# Patient Record
Sex: Male | Born: 1985
Health system: Southern US, Community
[De-identification: ages and names within clinical notes are randomized; demographics above are authoritative.]

## PROBLEM LIST (undated history)

## (undated) DIAGNOSIS — M549 Dorsalgia, unspecified: Secondary | ICD-10-CM

## (undated) DIAGNOSIS — K219 Gastro-esophageal reflux disease without esophagitis: Secondary | ICD-10-CM

## (undated) DIAGNOSIS — F32A Depression, unspecified: Secondary | ICD-10-CM

## (undated) DIAGNOSIS — F329 Major depressive disorder, single episode, unspecified: Secondary | ICD-10-CM

## (undated) DIAGNOSIS — F1910 Other psychoactive substance abuse, uncomplicated: Secondary | ICD-10-CM

## (undated) DIAGNOSIS — G8929 Other chronic pain: Secondary | ICD-10-CM

## (undated) DIAGNOSIS — F419 Anxiety disorder, unspecified: Secondary | ICD-10-CM

## (undated) HISTORY — PX: HERNIA REPAIR: SHX51

---
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
## 2010-03-19 ENCOUNTER — Emergency Department (HOSPITAL_COMMUNITY): Admission: EM | Admit: 2010-03-19 | Discharge: 2010-03-19 | Payer: Self-pay | Admitting: Emergency Medicine

## 2010-06-20 ENCOUNTER — Encounter (INDEPENDENT_AMBULATORY_CARE_PROVIDER_SITE_OTHER): Payer: Self-pay | Admitting: Family Medicine

## 2010-06-20 LAB — CONVERTED CEMR LAB
BUN: 16 mg/dL (ref 6–23)
CO2: 28 meq/L (ref 19–32)
Calcium: 9.9 mg/dL (ref 8.4–10.5)
Chloride: 103 meq/L (ref 96–112)
Creatinine, Ser: 1.06 mg/dL (ref 0.40–1.50)
Glucose, Bld: 90 mg/dL (ref 70–99)
HCT: 47.4 % (ref 39.0–52.0)
Helicobacter Pylori Antibody-IgG: <0.4
Hemoglobin: 16.1 g/dL (ref 13.0–17.0)
Lymphocytes Relative: 24 % (ref 12–46)
Monocytes Absolute: 0.7 10*3/uL (ref 0.1–1.0)
Monocytes Relative: 9 % (ref 3–12)
Neutro Abs: 4.6 10*3/uL (ref 1.7–7.7)
RBC: 5.25 M/uL (ref 4.22–5.81)

## 2010-09-11 ENCOUNTER — Other Ambulatory Visit: Payer: Self-pay | Admitting: Family Medicine

## 2010-09-11 ENCOUNTER — Ambulatory Visit (HOSPITAL_COMMUNITY)
Admission: RE | Admit: 2010-09-11 | Discharge: 2010-09-11 | Disposition: A | Discharge status: Home / Self Care | Payer: Self-pay | Source: Ambulatory Visit | Attending: Family Medicine | Admitting: Family Medicine

## 2010-09-11 DIAGNOSIS — R52 Pain, unspecified: Secondary | ICD-10-CM

## 2010-09-11 DIAGNOSIS — R079 Chest pain, unspecified: Secondary | ICD-10-CM | POA: Insufficient documentation

## 2010-09-19 ENCOUNTER — Encounter (INDEPENDENT_AMBULATORY_CARE_PROVIDER_SITE_OTHER): Payer: Self-pay | Admitting: Family Medicine

## 2010-09-19 LAB — CONVERTED CEMR LAB
Chlamydia, Swab/Urine, PCR: NEGATIVE
GC Probe Amp, Urine: NEGATIVE

## 2014-06-20 ENCOUNTER — Ambulatory Visit: Payer: Self-pay

## 2014-06-30 ENCOUNTER — Other Ambulatory Visit (HOSPITAL_COMMUNITY): Payer: Self-pay | Admitting: Neurosurgery

## 2014-07-03 ENCOUNTER — Encounter (HOSPITAL_COMMUNITY): Payer: Self-pay | Admitting: *Deleted

## 2014-07-03 NOTE — Progress Notes (Signed)
Pt denies SOB, chest pain, and being under the care of a cardiologist. Pt denies having a stress test, echo and cardiac cath. Pt denies having an EKG and chest x ray within the last year. Pt made aware to stop taking Aspirin, vitamins , and herbal medications ( Fish/mineral oils) . Do not take any NSAIDs ie: Ibuprofen, Advil, Naproxen or any medication containing Aspirin. Pt PCP Dr. Loma Senderharles [place], [place]

## 2014-07-04 ENCOUNTER — Encounter (HOSPITAL_COMMUNITY)
Admission: RE | Disposition: A | Discharge status: Home / Self Care | Payer: Self-pay | Source: Ambulatory Visit | Attending: Neurosurgery

## 2014-07-04 ENCOUNTER — Ambulatory Visit (HOSPITAL_COMMUNITY): Payer: 59 | Admitting: Anesthesiology

## 2014-07-04 ENCOUNTER — Ambulatory Visit (HOSPITAL_COMMUNITY): Payer: 59

## 2014-07-04 ENCOUNTER — Ambulatory Visit (HOSPITAL_COMMUNITY)
Admission: RE | Admit: 2014-07-04 | Discharge: 2014-07-04 | Disposition: A | Discharge status: Home / Self Care | Payer: 59 | Source: Ambulatory Visit | Attending: Neurosurgery | Admitting: Neurosurgery

## 2014-07-04 ENCOUNTER — Encounter (HOSPITAL_COMMUNITY): Payer: Self-pay | Admitting: *Deleted

## 2014-07-04 DIAGNOSIS — K219 Gastro-esophageal reflux disease without esophagitis: Secondary | ICD-10-CM | POA: Diagnosis not present

## 2014-07-04 DIAGNOSIS — F1099 Alcohol use, unspecified with unspecified alcohol-induced disorder: Secondary | ICD-10-CM | POA: Insufficient documentation

## 2014-07-04 DIAGNOSIS — M5126 Other intervertebral disc displacement, lumbar region: Secondary | ICD-10-CM | POA: Diagnosis not present

## 2014-07-04 DIAGNOSIS — M5127 Other intervertebral disc displacement, lumbosacral region: Secondary | ICD-10-CM

## 2014-07-04 HISTORY — DX: Other chronic pain: G89.29

## 2014-07-04 HISTORY — DX: Gastro-esophageal reflux disease without esophagitis: K21.9

## 2014-07-04 HISTORY — DX: Dorsalgia, unspecified: M54.9

## 2014-07-04 HISTORY — DX: Depression, unspecified: F32.A

## 2014-07-04 HISTORY — PX: SPINE SURGERY: SHX786

## 2014-07-04 HISTORY — DX: Major depressive disorder, single episode, unspecified: F32.9

## 2014-07-04 HISTORY — DX: Anxiety disorder, unspecified: F41.9

## 2014-07-04 HISTORY — PX: LUMBAR LAMINECTOMY/DECOMPRESSION MICRODISCECTOMY: SHX5026

## 2014-07-04 LAB — COMPREHENSIVE METABOLIC PANEL
ALT: 27 U/L (ref 0–53)
AST: 18 U/L (ref 0–37)
Albumin: 4.5 g/dL (ref 3.5–5.2)
Alkaline Phosphatase: 53 U/L (ref 39–117)
Anion gap: 15 (ref 5–15)
BUN: 16 mg/dL (ref 6–23)
CALCIUM: 9.3 mg/dL (ref 8.4–10.5)
CHLORIDE: 105 meq/L (ref 96–112)
CO2: 23 mEq/L (ref 19–32)
CREATININE: 1.09 mg/dL (ref 0.50–1.35)
Glucose, Bld: 100 mg/dL — ABNORMAL HIGH (ref 70–99)
POTASSIUM: 3.6 meq/L — AB (ref 3.7–5.3)
Sodium: 143 mEq/L (ref 137–147)
TOTAL PROTEIN: 7.3 g/dL (ref 6.0–8.3)
Total Bilirubin: 0.8 mg/dL (ref 0.3–1.2)

## 2014-07-04 LAB — CBC
HCT: 44.4 % (ref 39.0–52.0)
HEMOGLOBIN: 15.5 g/dL (ref 13.0–17.0)
MCH: 31.1 pg (ref 26.0–34.0)
MCHC: 34.9 g/dL (ref 30.0–36.0)
MCV: 89.2 fL (ref 78.0–100.0)
PLATELETS: 224 10*3/uL (ref 150–400)
RBC: 4.98 MIL/uL (ref 4.22–5.81)
RDW: 11.9 % (ref 11.5–15.5)
WBC: 5.6 10*3/uL (ref 4.0–10.5)

## 2014-07-04 LAB — SURGICAL PCR SCREEN
MRSA, PCR: NEGATIVE
STAPHYLOCOCCUS AUREUS: NEGATIVE

## 2014-07-04 SURGERY — LUMBAR LAMINECTOMY/DECOMPRESSION MICRODISCECTOMY 1 LEVEL
Anesthesia: General | Site: Spine Lumbar

## 2014-07-04 MED ORDER — FENTANYL CITRATE 0.05 MG/ML IJ SOLN
INTRAMUSCULAR | Status: DC | PRN
  Administered 2014-07-04: 200 ug via INTRAVENOUS
  Administered 2014-07-04: 50 ug via INTRAVENOUS

## 2014-07-04 MED ORDER — NEOSTIGMINE METHYLSULFATE 10 MG/10ML IV SOLN
INTRAVENOUS | Status: AC
  Filled 2014-07-04: qty 1

## 2014-07-04 MED ORDER — ACETAMINOPHEN 325 MG PO TABS: 650.0000 mg | ORAL_TABLET | ORAL | Status: DC | PRN

## 2014-07-04 MED ORDER — ONDANSETRON HCL 4 MG/2ML IJ SOLN: 4.0000 mg | INTRAMUSCULAR | Status: DC | PRN

## 2014-07-04 MED ORDER — DEXMEDETOMIDINE HCL 200 MCG/2ML IV SOLN
INTRAVENOUS | Status: DC | PRN
  Administered 2014-07-04 (×2): 12 ug via INTRAVENOUS
  Administered 2014-07-04: 8 ug via INTRAVENOUS

## 2014-07-04 MED ORDER — FENTANYL CITRATE 0.05 MG/ML IJ SOLN
INTRAMUSCULAR | Status: AC
  Filled 2014-07-04: qty 5

## 2014-07-04 MED ORDER — LACTATED RINGERS IV SOLN
INTRAVENOUS | Status: DC
  Administered 2014-07-04: 10:00:00 via INTRAVENOUS

## 2014-07-04 MED ORDER — OXYCODONE HCL 5 MG/5ML PO SOLN: 5.0000 mg | Freq: Once | ORAL | Status: AC | PRN

## 2014-07-04 MED ORDER — LIDOCAINE-EPINEPHRINE 0.5 %-1:200000 IJ SOLN
INTRAMUSCULAR | Status: DC | PRN
  Administered 2014-07-04: 10 mL

## 2014-07-04 MED ORDER — PROMETHAZINE HCL 25 MG/ML IJ SOLN: 6.2500 mg | INTRAMUSCULAR | Status: DC | PRN

## 2014-07-04 MED ORDER — ONDANSETRON HCL 4 MG/2ML IJ SOLN
INTRAMUSCULAR | Status: DC | PRN
Start: 2014-07-04 — End: 2014-07-04
  Administered 2014-07-04: 4 mg via INTRAVENOUS

## 2014-07-04 MED ORDER — MUPIROCIN 2 % EX OINT
TOPICAL_OINTMENT | Freq: Once | CUTANEOUS | Status: AC
  Administered 2014-07-04: 1 via NASAL
  Filled 2014-07-04: qty 22

## 2014-07-04 MED ORDER — MENTHOL 3 MG MT LOZG
1.0000 | LOZENGE | OROMUCOSAL | Status: DC | PRN
  Filled 2014-07-04: qty 9

## 2014-07-04 MED ORDER — ACETAMINOPHEN 650 MG RE SUPP: 650.0000 mg | RECTAL | Status: DC | PRN

## 2014-07-04 MED ORDER — SENNA 8.6 MG PO TABS: 1.0000 | ORAL_TABLET | Freq: Two times a day (BID) | ORAL | Status: DC

## 2014-07-04 MED ORDER — PROPOFOL 10 MG/ML IV BOLUS
INTRAVENOUS | Status: AC
  Filled 2014-07-04: qty 20

## 2014-07-04 MED ORDER — CYCLOBENZAPRINE HCL 10 MG PO TABS
10.0000 mg | ORAL_TABLET | Freq: Three times a day (TID) | ORAL | Status: DC | PRN
  Administered 2014-07-04: 10 mg via ORAL

## 2014-07-04 MED ORDER — HEMOSTATIC AGENTS (NO CHARGE) OPTIME
TOPICAL | Status: DC | PRN
  Administered 2014-07-04: 1 via TOPICAL

## 2014-07-04 MED ORDER — HYDROCODONE-ACETAMINOPHEN 5-325 MG PO TABS: 1.0000 | ORAL_TABLET | Freq: Four times a day (QID) | ORAL | Status: DC | PRN

## 2014-07-04 MED ORDER — OXYCODONE-ACETAMINOPHEN 5-325 MG PO TABS: 1.0000 | ORAL_TABLET | ORAL | Status: DC | PRN

## 2014-07-04 MED ORDER — ROCURONIUM BROMIDE 100 MG/10ML IV SOLN
INTRAVENOUS | Status: DC | PRN
  Administered 2014-07-04: 50 mg via INTRAVENOUS

## 2014-07-04 MED ORDER — SODIUM CHLORIDE 0.9 % IV SOLN: 250.0000 mL | INTRAVENOUS | Status: DC

## 2014-07-04 MED ORDER — OXYCODONE HCL 5 MG PO TABS
ORAL_TABLET | ORAL | Status: AC
  Filled 2014-07-04: qty 1

## 2014-07-04 MED ORDER — POLYETHYLENE GLYCOL 3350 17 G PO PACK
17.0000 g | PACK | Freq: Every day | ORAL | Status: DC | PRN
  Filled 2014-07-04: qty 1

## 2014-07-04 MED ORDER — ARTIFICIAL TEARS OP OINT
TOPICAL_OINTMENT | OPHTHALMIC | Status: DC | PRN
  Administered 2014-07-04: 1 via OPHTHALMIC

## 2014-07-04 MED ORDER — PHENOL 1.4 % MT LIQD: 1.0000 | OROMUCOSAL | Status: DC | PRN

## 2014-07-04 MED ORDER — POTASSIUM CHLORIDE IN NACL 20-0.9 MEQ/L-% IV SOLN
INTRAVENOUS | Status: DC
  Filled 2014-07-04 (×2): qty 1000

## 2014-07-04 MED ORDER — OXYCODONE HCL 5 MG PO TABS
5.0000 mg | ORAL_TABLET | Freq: Once | ORAL | Status: AC | PRN
  Administered 2014-07-04: 5 mg via ORAL

## 2014-07-04 MED ORDER — LACTATED RINGERS IV SOLN
INTRAVENOUS | Status: DC | PRN
  Administered 2014-07-04 (×2): via INTRAVENOUS

## 2014-07-04 MED ORDER — ONDANSETRON HCL 4 MG/2ML IJ SOLN
INTRAMUSCULAR | Status: AC
  Filled 2014-07-04: qty 2

## 2014-07-04 MED ORDER — GLYCOPYRROLATE 0.2 MG/ML IJ SOLN
INTRAMUSCULAR | Status: DC | PRN
  Administered 2014-07-04: .4 mg via INTRAVENOUS

## 2014-07-04 MED ORDER — DEXAMETHASONE SODIUM PHOSPHATE 10 MG/ML IJ SOLN
INTRAMUSCULAR | Status: AC
  Filled 2014-07-04: qty 1

## 2014-07-04 MED ORDER — LIDOCAINE HCL (CARDIAC) 20 MG/ML IV SOLN
INTRAVENOUS | Status: DC | PRN
  Administered 2014-07-04: 100 mg via INTRAVENOUS

## 2014-07-04 MED ORDER — HYDROMORPHONE HCL 1 MG/ML IJ SOLN
0.2500 mg | INTRAMUSCULAR | Status: DC | PRN
  Administered 2014-07-04 (×2): 0.5 mg via INTRAVENOUS

## 2014-07-04 MED ORDER — MIDAZOLAM HCL 5 MG/5ML IJ SOLN
INTRAMUSCULAR | Status: DC | PRN
  Administered 2014-07-04: 2 mg via INTRAVENOUS

## 2014-07-04 MED ORDER — CYCLOBENZAPRINE HCL 10 MG PO TABS: 10.0000 mg | ORAL_TABLET | Freq: Three times a day (TID) | ORAL | Status: DC | PRN

## 2014-07-04 MED ORDER — PHENYLEPH-SHARK LIV OIL-MO-PET 0.25-3-14-71.9 % RE OINT: 1.0000 "application " | TOPICAL_OINTMENT | Freq: Two times a day (BID) | RECTAL | Status: DC | PRN

## 2014-07-04 MED ORDER — CYCLOBENZAPRINE HCL 10 MG PO TABS
ORAL_TABLET | ORAL | Status: AC
  Filled 2014-07-04: qty 1

## 2014-07-04 MED ORDER — HYDROMORPHONE HCL 1 MG/ML IJ SOLN
INTRAMUSCULAR | Status: AC
  Filled 2014-07-04: qty 1

## 2014-07-04 MED ORDER — MIDAZOLAM HCL 2 MG/2ML IJ SOLN
INTRAMUSCULAR | Status: AC
  Filled 2014-07-04: qty 2

## 2014-07-04 MED ORDER — THROMBIN 5000 UNITS EX SOLR
CUTANEOUS | Status: DC | PRN
  Administered 2014-07-04 (×2): 5000 [IU] via TOPICAL

## 2014-07-04 MED ORDER — SODIUM CHLORIDE 0.9 % IJ SOLN: 3.0000 mL | Freq: Two times a day (BID) | INTRAMUSCULAR | Status: DC

## 2014-07-04 MED ORDER — HYDROMORPHONE HCL 1 MG/ML IJ SOLN: 0.5000 mg | INTRAMUSCULAR | Status: DC | PRN

## 2014-07-04 MED ORDER — DEXMEDETOMIDINE HCL IN NACL 200 MCG/50ML IV SOLN
INTRAVENOUS | Status: AC
  Filled 2014-07-04: qty 50

## 2014-07-04 MED ORDER — HYDROCODONE-ACETAMINOPHEN 5-325 MG PO TABS: 1.0000 | ORAL_TABLET | ORAL | Status: DC | PRN

## 2014-07-04 MED ORDER — DEXAMETHASONE SODIUM PHOSPHATE 10 MG/ML IJ SOLN
INTRAMUSCULAR | Status: DC | PRN
  Administered 2014-07-04: 10 mg via INTRAVENOUS

## 2014-07-04 MED ORDER — 0.9 % SODIUM CHLORIDE (POUR BTL) OPTIME
TOPICAL | Status: DC | PRN
  Administered 2014-07-04: 1000 mL

## 2014-07-04 MED ORDER — SODIUM CHLORIDE 0.9 % IJ SOLN
3.0000 mL | INTRAMUSCULAR | Status: DC | PRN
Start: 2014-07-04 — End: 2014-07-04

## 2014-07-04 MED ORDER — NEOSTIGMINE METHYLSULFATE 10 MG/10ML IV SOLN
INTRAVENOUS | Status: DC | PRN
  Administered 2014-07-04: 3 mg via INTRAVENOUS

## 2014-07-04 MED ORDER — CEFAZOLIN SODIUM-DEXTROSE 2-3 GM-% IV SOLR
INTRAVENOUS | Status: DC | PRN
  Administered 2014-07-04: 2 g via INTRAVENOUS

## 2014-07-04 MED ORDER — GLYCOPYRROLATE 0.2 MG/ML IJ SOLN
INTRAMUSCULAR | Status: AC
  Filled 2014-07-04: qty 2

## 2014-07-04 MED ORDER — PROPOFOL 10 MG/ML IV BOLUS
INTRAVENOUS | Status: DC | PRN
  Administered 2014-07-04: 200 mg via INTRAVENOUS

## 2014-07-04 SURGICAL SUPPLY — 53 items
APL SKNCLS STERI-STRIP NONHPOA (GAUZE/BANDAGES/DRESSINGS)
BAG DECANTER FOR FLEXI CONT (MISCELLANEOUS) IMPLANT
BENZOIN TINCTURE PRP APPL 2/3 (GAUZE/BANDAGES/DRESSINGS) IMPLANT
BLADE CLIPPER SURG (BLADE) IMPLANT
BUR MATCHSTICK NEURO 3.0 LAGG (BURR) ×2 IMPLANT
CANISTER SUCT 3000ML (MISCELLANEOUS) ×2 IMPLANT
CONT SPEC 4OZ CLIKSEAL STRL BL (MISCELLANEOUS) ×2 IMPLANT
DECANTER SPIKE VIAL GLASS SM (MISCELLANEOUS) ×2 IMPLANT
DRAPE LAPAROTOMY 100X72X124 (DRAPES) ×2 IMPLANT
DRAPE MICROSCOPE LEICA (MISCELLANEOUS) ×2 IMPLANT
DRAPE POUCH INSTRU U-SHP 10X18 (DRAPES) ×2 IMPLANT
DRAPE SURG 17X23 STRL (DRAPES) ×2 IMPLANT
DURAPREP 26ML APPLICATOR (WOUND CARE) ×2 IMPLANT
ELECT REM PT RETURN 9FT ADLT (ELECTROSURGICAL) ×2
ELECTRODE REM PT RTRN 9FT ADLT (ELECTROSURGICAL) ×1 IMPLANT
GAUZE SPONGE 4X4 12PLY STRL (GAUZE/BANDAGES/DRESSINGS) IMPLANT
GAUZE SPONGE 4X4 16PLY XRAY LF (GAUZE/BANDAGES/DRESSINGS) IMPLANT
GLOVE BIOGEL PI IND STRL 7.0 (GLOVE) IMPLANT
GLOVE BIOGEL PI IND STRL 8 (GLOVE) IMPLANT
GLOVE BIOGEL PI IND STRL 8.5 (GLOVE) IMPLANT
GLOVE BIOGEL PI INDICATOR 7.0 (GLOVE) ×2
GLOVE BIOGEL PI INDICATOR 8 (GLOVE) ×2
GLOVE BIOGEL PI INDICATOR 8.5 (GLOVE) ×1
GLOVE ECLIPSE 6.5 STRL STRAW (GLOVE) ×2 IMPLANT
GLOVE ECLIPSE 7.5 STRL STRAW (GLOVE) ×3 IMPLANT
GLOVE SS BIOGEL STRL SZ 6.5 (GLOVE) IMPLANT
GLOVE SUPERSENSE BIOGEL SZ 6.5 (GLOVE) ×2
GOWN STRL REUS W/ TWL LRG LVL3 (GOWN DISPOSABLE) ×2 IMPLANT
GOWN STRL REUS W/ TWL XL LVL3 (GOWN DISPOSABLE) IMPLANT
GOWN STRL REUS W/TWL 2XL LVL3 (GOWN DISPOSABLE) ×1 IMPLANT
GOWN STRL REUS W/TWL LRG LVL3 (GOWN DISPOSABLE) ×4
GOWN STRL REUS W/TWL XL LVL3 (GOWN DISPOSABLE)
KIT BASIN OR (CUSTOM PROCEDURE TRAY) ×2 IMPLANT
KIT ROOM TURNOVER OR (KITS) ×2 IMPLANT
LIQUID BAND (GAUZE/BANDAGES/DRESSINGS) ×2 IMPLANT
NDL HYPO 25X1 1.5 SAFETY (NEEDLE) ×1 IMPLANT
NEEDLE HYPO 25X1 1.5 SAFETY (NEEDLE) ×2 IMPLANT
NEEDLE SPNL 18GX3.5 QUINCKE PK (NEEDLE) ×2 IMPLANT
NS IRRIG 1000ML POUR BTL (IV SOLUTION) ×2 IMPLANT
PACK LAMINECTOMY NEURO (CUSTOM PROCEDURE TRAY) ×2 IMPLANT
PAD ARMBOARD 7.5X6 YLW CONV (MISCELLANEOUS) ×8 IMPLANT
RUBBERBAND STERILE (MISCELLANEOUS) ×4 IMPLANT
SPONGE LAP 4X18 X RAY DECT (DISPOSABLE) IMPLANT
SPONGE SURGIFOAM ABS GEL SZ50 (HEMOSTASIS) ×2 IMPLANT
STRIP CLOSURE SKIN 1/2X4 (GAUZE/BANDAGES/DRESSINGS) IMPLANT
SUT VIC AB 0 CT1 18XCR BRD8 (SUTURE) ×1 IMPLANT
SUT VIC AB 0 CT1 8-18 (SUTURE) ×2
SUT VIC AB 2-0 CT1 18 (SUTURE) ×2 IMPLANT
SUT VIC AB 3-0 SH 8-18 (SUTURE) ×2 IMPLANT
SYR 20ML ECCENTRIC (SYRINGE) ×2 IMPLANT
TOWEL OR 17X24 6PK STRL BLUE (TOWEL DISPOSABLE) ×2 IMPLANT
TOWEL OR 17X26 10 PK STRL BLUE (TOWEL DISPOSABLE) ×2 IMPLANT
WATER STERILE IRR 1000ML POUR (IV SOLUTION) ×2 IMPLANT

## 2014-07-04 NOTE — Anesthesia Preprocedure Evaluation (Addendum)
Anesthesia Evaluation  Patient identified by MRN, date of birth, ID band Patient awake    Reviewed: Allergy & Precautions, H&P , NPO status , Patient's Chart, lab work & pertinent test results  Airway Mallampati: I  TM Distance: >3 FB Neck ROM: Full    Dental  (+) Teeth Intact, Dental Advisory Given, Caps   Pulmonary neg pulmonary ROS,  breath sounds clear to auscultation        Cardiovascular negative cardio ROS  Rhythm:Regular Rate:Normal     Neuro/Psych negative neurological ROS     GI/Hepatic GERD-  ,(+)     substance abuse  alcohol use, Pt reports quit 2 weeks ago but endorses heavy 5x/wk ETOH abuse.   Endo/Other  negative endocrine ROS  Renal/GU negative Renal ROS     Musculoskeletal negative musculoskeletal ROS (+)   Abdominal   Peds  Hematology negative hematology ROS (+)   Anesthesia Other Findings Right front tooth capped  Reproductive/Obstetrics                           Anesthesia Physical Anesthesia Plan  ASA: II  Anesthesia Plan: General   Post-op Pain Management:    Induction: Intravenous  Airway Management Planned: Oral ETT  Additional Equipment:   Intra-op Plan:   Post-operative Plan: Extubation in OR  Informed Consent: I have reviewed the patients History and Physical, chart, labs and discussed the procedure including the risks, benefits and alternatives for the proposed anesthesia with the patient or authorized representative who has indicated his/her understanding and acceptance.   Dental advisory given and Dental Advisory Given  Plan Discussed with: CRNA, Surgeon and Anesthesiologist  Anesthesia Plan Comments:        Anesthesia Quick Evaluation

## 2014-07-04 NOTE — Discharge Instructions (Addendum)
Lumbar Discectomy Care After A discectomy involves removal of discmaterial (the cartilage-like structures located between the bones of the back). It is done to relieve pressure on nerve roots. It can be used as a treatment for a back problem. The time in surgery depends on the findings in surgery and what is necessary to correct the problems. HOME CARE INSTRUCTIONS   Check the cut (incision) made by the surgeon twice a day for signs of infection. Some signs of infection may include:   A foul smelling, greenish or yellowish discharge from the wound.   Increased pain.   Increased redness over the incision (operative) site.   The skin edges may separate.   Flu-like symptoms (problems).   A temperature above 101.5 F (38.6 C).   Change your bandages in about 24 to 36 hours following surgery or as directed.   You may shower tomrrow.  Avoid bathtubs, swimming pools and hot tubs for three weeks or until your incision has healed completely.  Follow your doctor's instructions as to safe activities, exercises, and physical therapy.   Weight reduction may be beneficial if you are overweight.   Daily exercise is helpful to prevent the return of problems. Walking is permitted. You may use a treadmill without an incline. Cut down on activities and exercise if you have discomfort. You may also go up and down stairs as much as you can tolerate.   DO NOT lift anything heavier than 10 to 15 lbs. Avoid bending or twisting at the waist. Always bend your knees when lifting.   Maintain strength and range of motion as instructed.   Do not drive for 10 days, or as directed by your doctors. You may be a passenger . Lying back in the passenger seat may be more comfortable for you. Always wear a seatbelt.   Limit your sitting in a regular chair to 20 to 30 minutes at a time. There are no limitations for sitting in a recliner. You should lie down or walk in between sitting periods.   Only take  over-the-counter or prescription medicines for pain, discomfort, or fever as directed by your caregiver.  SEEK MEDICAL CARE IF:   There is increased bleeding (more than a small spot) from the wound.   You notice redness, swelling, or increasing pain in the wound.   Pus is coming from wound.   You develop an unexplained oral temperature above 102 F (38.9 C) develops.   You notice a foul smell coming from the wound or dressing.   You have increasing pain in your wound.  SEEK IMMEDIATE MEDICAL CARE IF:   You develop a rash.   You have difficulty breathing.   You develop any allergic problems to medicines given.  Document Released: 07/02/2004 Document Revised: 07/17/2011 Document Reviewed: 10/21/2007 ExitCare Patient Information     Wound Care Leave incision open to air. You may shower. Do not scrub directly on incision.  Do not put any creams, lotions, or ointments on incision. Activity Walk each and every day, increasing distance each day. No lifting greater than 5 lbs.  Avoid bending, arching, and twisting. No driving for 2 weeks; may ride as a passenger locally.  Diet Resume your normal diet.  Return to Work Will be discussed at you follow up appointment. Call Your Doctor If Any of These Occur Redness, drainage, or swelling at the wound.  Temperature greater than 101 degrees. Severe pain not relieved by pain medication. Incision starts to come apart. Follow Up Appt  Call today for appointment in 4 weeks [phone] or for problems.  If you have any hardware placed in your spine, you will need an x-ray before your appointment.

## 2014-07-04 NOTE — Op Note (Signed)
07/04/2014  3:54 PM  PATIENT:  Daniel Phelps  28 y.o. male with pain in the left S1 distribution, and a large herniated disc at L5/S1 eccentric to the left. No response to conservative treatment  PRE-OPERATIVE DIAGNOSIS:  lumbar herniated disc L5/S1  POST-OPERATIVE DIAGNOSIS:  lumbar herniated disc L5/S1  PROCEDURE:  Procedure(s): LUMBAR LAMINECTOMY/DECOMPRESSION MICRODISCECTOMY 1 LEVEL,LEFT LUMBAR FIVE-SACRAL ONE  SURGEON:  Prinston Kynard L  ASSISTANTS:Stern, joseph   ANESTHESIA:   general  EBL:  Total I/O In: 1500 [I.V.:1500] Out: -   BLOOD ADMINISTERED:none  CELL SAVER GIVEN:none  COUNT:per nursing  DRAINS: none   SPECIMEN:  No Specimen  DICTATION: Mr. Daniel Phelps was taken to the operating room, intubated and placed under a general anesthetic without difficulty. He was positioned prone on a Wilson frame with all pressure points padded. His back was prepped and draped in a sterile manner. I opened the skin with a 10 blade and carried the dissection down to the thoracolumbar fascia. I used both sharp dissection and the monopolar cautery to expose the lamina of L5, and S1. I confirmed my location with an intraoperative xray.  I used the drill, Kerrison punches, and curettes to perform a semihemilaminectomy of L5. I used the punches to remove the ligamentum flavum to expose the thecal sac. I brought the microscope into the operative field and with Dr.Stern's assistance we started our decompression of the spinal canal, thecal sac and S1 root(s). I cauterized epidural veins overlying the disc space then divided them sharply. I opened the disc space with a 15 blade and proceeded with the discectomy. I used pituitary rongeurs, curettes, and other instruments to remove disc material. We did remove a significant amount of disc just underneath the annulus.  After the discectomy was completed we inspected the S1 nerve root and felt it was well decompressed. I explored rostrally, laterally, medially, and  caudally and was satisfied with the decompression. I irrigated the wound, then closed in layers. I approximated the thoracolumbar fascia, subcutaneous, and subcuticular planes with vicryl sutures. I used dermabond for a sterile dressing.   PLAN OF CARE: Admit for overnight observation  PATIENT DISPOSITION:  PACU - hemodynamically stable.   Delay start of Pharmacological VTE agent (>24hrs) due to surgical blood loss or risk of bleeding:  yes

## 2014-07-04 NOTE — Transfer of Care (Signed)
Immediate Anesthesia Transfer of Care Note  Patient: Daniel Phelps  Procedure(s) Performed: Procedure(s) with comments: LUMBAR LAMINECTOMY/DECOMPRESSION MICRODISCECTOMY 1 LEVEL,LEFT LUMBAR FIVE-SACRAL ONE (N/A) - left  Patient Location: PACU  Anesthesia Type:General  Level of Consciousness: awake, alert  and oriented  Airway & Oxygen Therapy: Patient Spontanous Breathing and Patient connected to nasal cannula oxygen  Post-op Assessment: Report given to PACU RN and Post -op Vital signs reviewed and stable  Post vital signs: Reviewed and stable  Complications: No apparent anesthesia complications

## 2014-07-04 NOTE — Plan of Care (Signed)
Problem: Consults Goal: Spinal Surgery Patient Education See Patient Education Module for education specifics. Outcome: Completed/Met Date Met:  07/04/14

## 2014-07-04 NOTE — Anesthesia Postprocedure Evaluation (Signed)
  Anesthesia Post-op Note  Patient: Daniel Phelps  Procedure(s) Performed: Procedure(s) with comments: LUMBAR LAMINECTOMY/DECOMPRESSION MICRODISCECTOMY 1 LEVEL,LEFT LUMBAR FIVE-SACRAL ONE (N/A) - left  Patient Location: PACU  Anesthesia Type:General  Level of Consciousness: awake  Airway and Oxygen Therapy: Patient Spontanous Breathing  Post-op Pain: mild  Post-op Assessment: Post-op Vital signs reviewed  Post-op Vital Signs: Reviewed  Last Vitals:  Filed Vitals:   07/04/14 1700  BP:   Pulse: 52  Temp:   Resp: 14    Complications: No apparent anesthesia complications

## 2014-07-04 NOTE — Anesthesia Procedure Notes (Signed)
Procedure Name: Intubation Date/Time: 07/04/2014 2:02 PM Performed by: Carmela RimaMARTINELLI, Crystalyn Delia F Pre-anesthesia Checklist: Patient identified, Timeout performed, Emergency Drugs available, Suction available and Patient being monitored Patient Re-evaluated:Patient Re-evaluated prior to inductionOxygen Delivery Method: Circle system utilized Preoxygenation: Pre-oxygenation with 100% oxygen Intubation Type: IV induction Laryngoscope Size: Mac and 3 Grade View: Grade I Tube type: Oral Tube size: 7.5 mm Number of attempts: 1 Placement Confirmation: positive ETCO2,  ETT inserted through vocal cords under direct vision and breath sounds checked- equal and bilateral Secured at: 23 cm Tube secured with: Tape Dental Injury: Teeth and Oropharynx as per pre-operative assessment

## 2014-07-04 NOTE — Progress Notes (Signed)
Discharge instructions/education given to patient with dad at bedside, they both verbalized understanding. No signs of infection on incision site, no drainage noted.

## 2014-07-04 NOTE — Discharge Summary (Signed)
  Physician Discharge Summary  Patient ID: Daniel Phelps MRN: [id number] DOB/AGE: 30/03/1986 28 y.o.  Admit date: 07/04/2014 Discharge date: 07/04/2014  Admission Diagnoses:HNP left L5/S1 Left S1 radiculopathy  Discharge Diagnoses:  Active Problems:   HNP (herniated nucleus pulposus), lumbar   Discharged Condition: good  Hospital Course: Daniel Phelps was admitted and taken to the operating room for an uncomplicated discetomy. His wound is clean, dry, and without signs of infection. He is moving all his extremities well.  He is tolerating a regular diet, ambulating, and voiding without difficulty.   Treatments: surgery: Left L5 semihemilaminectomy and L5/S1 discetomy   Discharge Exam: Blood pressure 132/75, pulse 67, temperature 98.1 F (36.7 C), temperature source Oral, resp. rate 18, height 6\' 2"  (1.88 m), weight 97.523 kg (215 lb), SpO2 99 %. General appearance: alert, cooperative, appears stated age and no distress Neurologic: Alert and oriented X 3, normal strength and tone. Normal symmetric reflexes. Normal coordination and gait  Disposition: Final discharge disposition not confirmed lumbar herniated disc    Medication List    TAKE these medications        cyclobenzaprine 10 MG tablet  Commonly known as:  FLEXERIL  Take 1 tablet (10 mg total) by mouth 3 (three) times daily as needed for muscle spasms.     FISH OIL PO  Take 1 tablet by mouth daily.     HYDROcodone-acetaminophen 5-325 MG per tablet  Commonly known as:  NORCO/VICODIN  Take 1 tablet by mouth every 6 (six) hours as needed for moderate pain.     MULTIVITAMIN PO  Take 1 tablet by mouth daily.     phenylephrine-shark liver oil-mineral oil-petrolatum 0.25-3-14-71.9 % rectal ointment  Commonly known as:  PREPARATION H  Place 1 application rectally 2 (two) times daily as needed for hemorrhoids.         Signed: Lisle Skillman L 07/04/2014, 5:41 PM

## 2014-07-04 NOTE — Plan of Care (Signed)
Problem: Consults Goal: Diagnosis - Spinal Surgery Outcome: Completed/Met Date Met:  07/04/14 Lumbar Laminectomy (Complex)

## 2014-07-04 NOTE — H&P (Signed)
Daniel Phelps is an 29 y.o. male.   Chief Complaint: left lower extremity pain HPI: pain in left lower extremity x 4 months. No response to therapy, medications, chiropractic treatment. MRI revealed an hnp at L5/S1 on the left.   Past Medical History  Diagnosis Date  . Chronic back pain   . Anxiety   . Depression   . GERD (gastroesophageal reflux disease)     Past Surgical History  Procedure Laterality Date  . Hernia repair      Family History  Problem Relation Age of Onset  . Other Mother   . Hernia Father    Social History:  reports that he has never smoked. He has never used smokeless tobacco. He reports that he drinks alcohol. He reports that he uses illicit drugs (Marijuana).  Allergies:  Allergies  Allergen Reactions  . Ibuprofen Other (See Comments)    Sends pain down spine    Medications Prior to Admission  Medication Sig Dispense Refill  . Multiple Vitamins-Minerals (MULTIVITAMIN PO) Take 1 tablet by mouth daily.    . Omega-3 Fatty Acids (FISH OIL PO) Take 1 tablet by mouth daily.    . phenylephrine-shark liver oil-mineral oil-petrolatum (PREPARATION H) 0.25-3-14-71.9 % rectal ointment Place 1 application rectally 2 (two) times daily as needed for hemorrhoids.      Results for orders placed or performed during the hospital encounter of 07/04/14 (from the past 48 hour(s))  Surgical pcr screen     Status: None   Collection Time: 07/04/14  9:28 AM  Result Value Ref Range   MRSA, PCR NEGATIVE NEGATIVE   Staphylococcus aureus NEGATIVE NEGATIVE    Comment:        The Xpert SA Assay (FDA approved for NASAL specimens in patients over 93 years of age), is one component of a comprehensive surveillance program.  Test performance has been validated by [employer] for patients greater than or equal to 61 year old. It is not intended to diagnose infection nor to guide or monitor treatment.   Comprehensive metabolic panel     Status: Abnormal   Collection Time:  07/04/14  9:39 AM  Result Value Ref Range   Sodium 143 137 - 147 mEq/Phelps   Potassium 3.6 (Phelps) 3.7 - 5.3 mEq/Phelps   Chloride 105 96 - 112 mEq/Phelps   CO2 23 19 - 32 mEq/Phelps   Glucose, Bld 100 (H) 70 - 99 mg/dL   BUN 16 6 - 23 mg/dL   Creatinine, Ser 1.09 0.50 - 1.35 mg/dL   Calcium 9.3 8.4 - 10.5 mg/dL   Total Protein 7.3 6.0 - 8.3 g/dL   Albumin 4.5 3.5 - 5.2 g/dL   AST 18 0 - 37 U/Phelps   ALT 27 0 - 53 U/Phelps   Alkaline Phosphatase 53 39 - 117 U/Phelps   Total Bilirubin 0.8 0.3 - 1.2 mg/dL   GFR calc non Af Amer >90 >90 mL/min   GFR calc Af Amer >90 >90 mL/min    Comment: (NOTE) The eGFR has been calculated using the CKD EPI equation. This calculation has not been validated in all clinical situations. eGFR's persistently <90 mL/min signify possible Chronic Kidney Disease.    Anion gap 15 5 - 15  CBC     Status: None   Collection Time: 07/04/14  9:39 AM  Result Value Ref Range   WBC 5.6 4.0 - 10.5 K/uL   RBC 4.98 4.22 - 5.81 MIL/uL   Hemoglobin 15.5 13.0 - 17.0 g/dL  HCT 44.4 39.0 - 52.0 %   MCV 89.2 78.0 - 100.0 fL   MCH 31.1 26.0 - 34.0 pg   MCHC 34.9 30.0 - 36.0 g/dL   RDW 11.9 11.5 - 15.5 %   Platelets 224 150 - 400 K/uL   No results found.  Review of Systems  HENT: Negative.   Eyes: Negative.   Respiratory: Negative.   Cardiovascular: Negative.   Gastrointestinal: Negative.   Genitourinary: Negative.   Musculoskeletal: Positive for back pain.  Skin: Negative.   Neurological: Positive for weakness.  Endo/Heme/Allergies: Negative.   Psychiatric/Behavioral: Negative.     Blood pressure 137/78, pulse 71, temperature 97.1 F (36.2 C), temperature source Oral, resp. rate 20, height _0  (1.88 m), weight 97.523 kg (215 lb), SpO2 100 %. Physical Exam  Vitals reviewed. Constitutional: He is oriented to person, place, and time. He appears well-developed and well-nourished.  HENT:  Head: Normocephalic and atraumatic.  Right Ear: External ear normal.  Left Ear: External ear normal.   Mouth/Throat: Oropharynx is clear and moist.  Eyes: Conjunctivae and EOM are normal. Pupils are equal, round, and reactive to light.  Neck: Normal range of motion. Neck supple.  Cardiovascular: Normal rate, regular rhythm and normal heart sounds.   Respiratory: Effort normal and breath sounds normal.  GI: Soft. Bowel sounds are normal.  Musculoskeletal: Normal range of motion.  Neurological: He is alert and oriented to person, place, and time. He displays abnormal reflex. No cranial nerve deficit. He exhibits normal muscle tone. He displays a negative Romberg sign. Gait abnormal. Coordination normal. He displays no Babinski's sign on the right side. He displays no Babinski's sign on the left side.  Mild weakness left gastrocnemius, can toe walk, can heel walk Proprioception is normal Decreased left ankle jerk     Assessment/Plan Or for decompression  Daniel Phelps 07/04/2014, 1:21 PM

## 2014-07-05 ENCOUNTER — Encounter (HOSPITAL_COMMUNITY): Payer: Self-pay | Admitting: Neurosurgery

## 2015-08-14 ENCOUNTER — Encounter: Payer: Self-pay | Admitting: Family Medicine

## 2015-08-14 ENCOUNTER — Ambulatory Visit (INDEPENDENT_AMBULATORY_CARE_PROVIDER_SITE_OTHER): Payer: BLUE CROSS/BLUE SHIELD | Admitting: Family Medicine

## 2015-08-14 VITALS — BP 136/83 | HR 83 | Temp 98.9°F | Resp 16 | Ht 73.0 in | Wt 209.4 lb

## 2015-08-14 DIAGNOSIS — Z130 Encounter for screening for diseases of the blood and blood-forming organs and certain disorders involving the immune mechanism: Secondary | ICD-10-CM | POA: Diagnosis not present

## 2015-08-14 DIAGNOSIS — T700XXA Otitic barotrauma, initial encounter: Secondary | ICD-10-CM

## 2015-08-14 DIAGNOSIS — F1010 Alcohol abuse, uncomplicated: Secondary | ICD-10-CM | POA: Diagnosis not present

## 2015-08-14 DIAGNOSIS — Z1389 Encounter for screening for other disorder: Secondary | ICD-10-CM | POA: Diagnosis not present

## 2015-08-14 DIAGNOSIS — Z13220 Encounter for screening for lipoid disorders: Secondary | ICD-10-CM | POA: Diagnosis not present

## 2015-08-14 DIAGNOSIS — M5442 Lumbago with sciatica, left side: Secondary | ICD-10-CM

## 2015-08-14 DIAGNOSIS — M5489 Other dorsalgia: Secondary | ICD-10-CM

## 2015-08-14 LAB — COMPLETE METABOLIC PANEL WITH GFR
ALT: 59 U/L — ABNORMAL HIGH (ref 9–46)
AST: 34 U/L (ref 10–40)
Albumin: 4.6 g/dL (ref 3.6–5.1)
Alkaline Phosphatase: 60 U/L (ref 40–115)
BUN: 16 mg/dL (ref 7–25)
CALCIUM: 9 mg/dL (ref 8.6–10.3)
CHLORIDE: 103 mmol/L (ref 98–110)
CO2: 26 mmol/L (ref 20–31)
Creat: 0.97 mg/dL (ref 0.60–1.35)
Glucose, Bld: 84 mg/dL (ref 65–99)
POTASSIUM: 4.2 mmol/L (ref 3.5–5.3)
Sodium: 141 mmol/L (ref 135–146)
Total Bilirubin: 0.6 mg/dL (ref 0.2–1.2)
Total Protein: 6.9 g/dL (ref 6.1–8.1)

## 2015-08-14 LAB — CBC
HEMATOCRIT: 44 % (ref 39.0–52.0)
Hemoglobin: 15.8 g/dL (ref 13.0–17.0)
MCH: 31.3 pg (ref 26.0–34.0)
MCHC: 35.9 g/dL (ref 30.0–36.0)
MCV: 87.3 fL (ref 78.0–100.0)
MPV: 9.6 fL (ref 8.6–12.4)
Platelets: 237 10*3/uL (ref 150–400)
RBC: 5.04 MIL/uL (ref 4.22–5.81)
RDW: 13 % (ref 11.5–15.5)
WBC: 8.4 10*3/uL (ref 4.0–10.5)

## 2015-08-14 LAB — LIPID PANEL
CHOL/HDL RATIO: 3.4 ratio (ref <=5.0)
CHOLESTEROL: 136 mg/dL (ref 125–200)
HDL: 40 mg/dL (ref >=40)
LDL Cholesterol: 59 mg/dL (ref <130)
TRIGLYCERIDES: 186 mg/dL — AB (ref <150)
VLDL: 37 mg/dL — AB (ref <30)

## 2015-08-14 MED ORDER — MELOXICAM 15 MG PO TABS: 15.0000 mg | ORAL_TABLET | Freq: Every day | ORAL | Status: DC

## 2015-08-14 NOTE — Patient Instructions (Signed)
For ear pressure, can try Afrin nasal spray twice a day for 3 day, regular saline spray, use Sudafed 2-3 times a day (avoid at bedtime, may keep you awake)  For your back- [facility]- [phone] [facility]- [phone]  Barotitis Media Barotitis media is inflammation of your middle ear. This occurs when the auditory tube (eustachian tube) leading from the back of your nose (nasopharynx) to your eardrum is blocked. This blockage may result from a cold, environmental allergies, or an upper respiratory infection. Unresolved barotitis media may lead to damage or hearing loss (barotrauma), which may become permanent. HOME CARE INSTRUCTIONS   Use medicines as recommended by your health care provider. Over-the-counter medicines will help unblock the canal and can help during times of air travel.  Do not put anything into your ears to clean or unplug them. Eardrops will not be helpful.  Do not swim, dive, or fly until your health care provider says it is all right to do so. If these activities are necessary, chewing gum with frequent, forceful swallowing may help. It is also helpful to hold your nose and gently blow to pop your ears for equalizing pressure changes. This forces air into the eustachian tube.  Only take over-the-counter or prescription medicines for pain, discomfort, or fever as directed by your health care provider.  A decongestant may be helpful in decongesting the middle ear and make pressure equalization easier. SEEK MEDICAL CARE IF:  You experience a serious form of dizziness in which you feel as if the room is spinning and you feel nauseated (vertigo).  Your symptoms only involve one ear. SEEK IMMEDIATE MEDICAL CARE IF:   You develop a severe headache, dizziness, or severe ear pain.  You have bloody or pus-like drainage from your ears.  You develop a fever.  Your problems do not improve or become worse. MAKE SURE YOU:   Understand  these instructions.  Will watch your condition.  Will get help right away if you are not doing well or get worse.   This information is not intended to replace advice given to you by your health care provider. Make sure you discuss any questions you have with your health care provider.   Document Released: 07/25/2000 Document Revised: 05/18/2013 Document Reviewed: 02/22/2013 Elsevier Interactive Patient Education [employer]2016 [employer]

## 2015-08-14 NOTE — Progress Notes (Signed)
Subjective:    Patient ID: Daniel Phelps, male    DOB: 12/20/1985, 30 y.o.   MRN: [id number]  HPI This is a 30 yo male who presents today to establish care. He was previously seen at Dr. Vear ClockPhillips office.  He has been having intermittent left ear pain for 3 days, worse with cold temperature (under 70). He also gets a sore throat with cold weather. He took some cold and flu medication without relief. No fever. Left sided nasal congestion. Denies history of allergies, has frequent nasal congestion and runny nose. No cough, no ear drainage. Little nasal drainage.   He had a diskectomy (L5/S1) 11/15. He has left sided sciatic pain if he drives for a long time or if he works. He has pain most days. He is requesting hydrocodone. He reports that ibuprofen makes his "spine hurt." He has not tried other NSAIDS. He did not have PT following surgery. He saw a chiropractor for awhile, but was unable to afford treatments. Gets some relief with acetaminophen. No weakness, no falls.   He has been trying to stop drinking and follow God. Last alcohol use 08/11/15, consumed 1/5 of vodka. He drinks when he is bored. Usually in social situations. "Drinks to get drunk." Does not think he can drink in moderation. Currently drinks 2-3 times per week.   Past Medical History  Diagnosis Date  . Chronic back pain   . Anxiety   . Depression   . GERD (gastroesophageal reflux disease)    Past Surgical History  Procedure Laterality Date  . Hernia repair    . Lumbar laminectomy/decompression microdiscectomy N/A 07/04/2014    Procedure: LUMBAR LAMINECTOMY/DECOMPRESSION MICRODISCECTOMY 1 LEVEL,LEFT LUMBAR FIVE-SACRAL ONE;  Surgeon: Coletta MemosKyle Cabbell, MD;  Location: [facility] OR;  Service: Neurosurgery;  Laterality: N/A;  left  . Spine surgery  07/05/2015   Family History  Problem Relation Age of Onset  . Other Mother   . Hernia Father    Social History  Substance Use Topics  . Smoking status: Never Smoker   . Smokeless tobacco:  Never Used  . Alcohol Use: Yes     Comment: 3-4 times per week; last used first week of November 2015   Review of Systems Per HPI    Objective:   Physical Exam  Constitutional: He is oriented to person, place, and time. He appears well-developed and well-nourished. No distress.  HENT:  Head: Normocephalic and atraumatic.  Right Ear: External ear and ear canal normal. A middle ear effusion is present.  Left Ear: External ear and ear canal normal. A middle ear effusion is present.  Nose: Mucosal edema and rhinorrhea present. Right sinus exhibits no maxillary sinus tenderness and no frontal sinus tenderness. Left sinus exhibits no maxillary sinus tenderness and no frontal sinus tenderness.  Mouth/Throat: Uvula is midline. Posterior oropharyngeal erythema present. No oropharyngeal exudate, posterior oropharyngeal edema or tonsillar abscesses.  Neck: Normal range of motion. Neck supple.  Cardiovascular: Normal rate, regular rhythm and normal heart sounds.   Pulmonary/Chest: Effort normal and breath sounds normal.  Musculoskeletal: Normal range of motion.  Lymphadenopathy:    He has no cervical adenopathy.  Neurological: He is alert and oriented to person, place, and time.  Skin: Skin is warm and dry. He is not diaphoretic.  Psychiatric: He has a normal mood and affect. His behavior is normal. Judgment and thought content normal.  Vitals reviewed.  BP 136/83 mmHg  Pulse 83  Temp(Src) 98.9 F (37.2 C) (Oral)  Resp 16  Ht 6\' 1"  (1.854 m)  Wt 209 lb 6.4 oz (94.983 kg)  BMI 27.63 kg/m2  SpO2 98%     Assessment & Plan:  1. Midline low back pain with left-sided sciatica - Discussed treatment options and why narcotic pain medication is not appropriate due to potential for abuse and chronic nature of condition. Offered patient to referral for additional therapy. He would like to research options himself and he will let me know if he needs referral. Provided numbers for [facility]  and Dr. Ethelene Hal.  - Encouraged continued exercise and maintaining healthy weight.  - meloxicam (MOBIC) 15 MG tablet; Take 1 tablet (15 mg total) by mouth daily.  Dispense: 30 tablet; Refill: 1  2. Screening for deficiency anemia - CBC  3. Screening for nephropathy - COMPLETE METABOLIC PANEL WITH GFR  4. Screening for lipid disorders - Lipid panel  5. Alcohol consumption binge drinking - Encouraged patient to abstain from alcohol completely if he can not drink 2 or fewer drinks when drinking, he is comfortable in his plan. Discussed avoiding triggers for drinking and participating in activities not involving drinking - COMPLETE METABOLIC PANEL WITH GFR  6. Barotitis media, initial encounter - Provided written and verbal information regarding diagnosis and treatment. - offered inhaled nasal corticosteroid, patient would prefer to try some other remedies first. - He will follow up if worsening symptoms or if no improvement in a couple of weeks.    Olean Ree, FNP-BC  [facility], [facility]  08/14/2015 5:14 PM

## 2016-10-15 ENCOUNTER — Encounter (HOSPITAL_COMMUNITY): Payer: Self-pay | Admitting: *Deleted

## 2016-10-15 ENCOUNTER — Ambulatory Visit (HOSPITAL_COMMUNITY)
Admission: EM | Admit: 2016-10-15 | Discharge: 2016-10-15 | Disposition: A | Discharge status: Home / Self Care | Payer: BLUE CROSS/BLUE SHIELD | Attending: Family Medicine | Admitting: Family Medicine

## 2016-10-15 DIAGNOSIS — J111 Influenza due to unidentified influenza virus with other respiratory manifestations: Secondary | ICD-10-CM

## 2016-10-15 DIAGNOSIS — R69 Illness, unspecified: Secondary | ICD-10-CM

## 2016-10-15 MED ORDER — TRAMADOL HCL 50 MG PO TABS: 50.0000 mg | ORAL_TABLET | Freq: Four times a day (QID) | ORAL | 0 refills | Status: DC | PRN

## 2016-10-15 MED ORDER — OSELTAMIVIR PHOSPHATE 75 MG PO CAPS: 75.0000 mg | ORAL_CAPSULE | Freq: Two times a day (BID) | ORAL | 0 refills | Status: DC

## 2016-10-15 NOTE — Discharge Instructions (Signed)
Allegra or Zyrtec daily as needed for drainage and runny nose. °For stronger antihistamine may take Chlor-Trimeton 2 to 4 mg every 4 to 6 hours, may cause drowsiness. °Saline nasal spray used frequently. °Ibuprofen 600 mg every 6 hours as needed for pain, discomfort or fever. °Drink plenty of fluids and stay well-hydrated. °

## 2016-10-15 NOTE — ED Triage Notes (Signed)
Pt  Reports   Cough     And   Congestion  With  Symptoms  Of  Sweats      And    Pain  In  Upper  Chest  And   Abdomen     Headache  When he  Coughs      Symptoms     Worse     When  Lays  Down

## 2016-10-15 NOTE — ED Provider Notes (Signed)
CSN: [id number]     Arrival date & time 10/15/16  1259 History   First MD Initiated Contact with Patient 10/15/16 1408     Chief Complaint  Patient presents with  . Cough   (Consider location/radiation/quality/duration/timing/severity/associated sxs/prior Treatment) 31 year old male presents to the urgent care stating yesterday, questionably, he developed cough, chills ribs hurting when he coughs, feeling warm and developing shortness of breath when he coughs only. His significant other states that he had a fever by feeling his forehead. It was not measured. Today 100.8.      Past Medical History:  Diagnosis Date  . Anxiety   . Chronic back pain   . Depression   . GERD (gastroesophageal reflux disease)    Past Surgical History:  Procedure Laterality Date  . HERNIA REPAIR    . LUMBAR LAMINECTOMY/DECOMPRESSION MICRODISCECTOMY N/A 07/04/2014   Procedure: LUMBAR LAMINECTOMY/DECOMPRESSION MICRODISCECTOMY 1 LEVEL,LEFT LUMBAR FIVE-SACRAL ONE;  Surgeon: Coletta MemosKyle Cabbell, MD;  Location: [facility] OR;  Service: Neurosurgery;  Laterality: N/A;  left  . SPINE SURGERY  07/05/2015   Family History  Problem Relation Age of Onset  . Other Mother   . Hernia Father    Social History  Substance Use Topics  . Smoking status: Never Smoker  . Smokeless tobacco: Never Used  . Alcohol use Yes     Comment: 3-4 times per week; last used first week of November 2015    Review of Systems  Constitutional: Positive for activity change, chills and fever. Negative for diaphoresis and fatigue.  HENT: Negative for congestion, ear pain, facial swelling, postnasal drip, rhinorrhea, sore throat and trouble swallowing.   Eyes: Negative for pain, discharge and redness.  Respiratory: Positive for cough. Negative for chest tightness, shortness of breath and wheezing.   Cardiovascular: Negative.   Gastrointestinal: Negative.   Musculoskeletal: Positive for myalgias. Negative for neck pain and neck stiffness.   Neurological: Positive for headaches.       Headache with cough only.  All other systems reviewed and are negative.   Allergies  Ibuprofen  Home Medications   Prior to Admission medications   Medication Sig Start Date End Date Taking? Authorizing Provider  oseltamivir (TAMIFLU) 75 MG capsule Take 1 capsule (75 mg total) by mouth 2 (two) times daily. X 5 days 10/15/16   Hayden Rasmussenavid Rashod Gougeon, NP  traMADol (ULTRAM) 50 MG tablet Take 1 tablet (50 mg total) by mouth every 6 (six) hours as needed for moderate pain. 10/15/16   Hayden Rasmussenavid Merian Wroe, NP   Meds Ordered and Administered this Visit  Medications - No data to display  BP 146/84 (BP Location: Right Arm)   Pulse 99   Temp 100.8 F (38.2 C)   Resp 18   SpO2 98%  No data found.   Physical Exam  Constitutional: He is oriented to person, place, and time. He appears well-developed and well-nourished. No distress.  Appears generally well. Speech and movement energetic. Not lethargic.  HENT:  Head: Normocephalic and atraumatic.  Mouth/Throat: No oropharyngeal exudate.  Bilateral TMs are retracted. Oropharynx with cobblestoning, streaky erythema and clear PND.  Eyes: EOM are normal.  Neck: Normal range of motion. Neck supple.  Cardiovascular: Normal rate, regular rhythm, normal heart sounds and intact distal pulses.   Pulmonary/Chest: Effort normal and breath sounds normal. No respiratory distress. He has no wheezes. He has no rales.  Musculoskeletal: Normal range of motion. He exhibits no edema.  Lymphadenopathy:    He has no cervical adenopathy.  Neurological: He is alert and  oriented to person, place, and time. He exhibits normal muscle tone. Coordination normal.  Skin: Skin is warm and dry. No rash noted. He is not diaphoretic.  Psychiatric: He has a normal mood and affect. His behavior is normal.  Nursing note and vitals reviewed.   Urgent Care Course     Procedures (including critical care time)  Labs Review Labs Reviewed - No data to  display  Imaging Review No results found.   Visual Acuity Review  Right Eye Distance:   Left Eye Distance:   Bilateral Distance:    Right Eye Near:   Left Eye Near:    Bilateral Near:         MDM   1. Influenza-like illness    Allegra or Zyrtec daily as needed for drainage and runny nose. For stronger antihistamine may take Chlor-Trimeton 2 to 4 mg every 4 to 6 hours, may cause drowsiness. Saline nasal spray used frequently. Ibuprofen 600 mg every 6 hours as needed for pain, discomfort or fever. Drink plenty of fluids and stay well-hydrated. Meds ordered this encounter  Medications  . traMADol (ULTRAM) 50 MG tablet    Sig: Take 1 tablet (50 mg total) by mouth every 6 (six) hours as needed for moderate pain.    Dispense:  15 tablet    Refill:  0    Order Specific Question:   Supervising Provider    Answer:   Elvina Sidle [[id number]]  . oseltamivir (TAMIFLU) 75 MG capsule    Sig: Take 1 capsule (75 mg total) by mouth 2 (two) times daily. X 5 days    Dispense:  10 capsule    Refill:  0    Order Specific Question:   Supervising Provider    Answer:   Elvina Sidle [[id number]]       Hayden Rasmussen, NP 10/15/16 [phone]

## 2017-02-16 ENCOUNTER — Encounter: Payer: Self-pay | Admitting: Emergency Medicine

## 2017-02-16 ENCOUNTER — Ambulatory Visit: Payer: BLUE CROSS/BLUE SHIELD | Admitting: Family Medicine

## 2017-02-16 ENCOUNTER — Ambulatory Visit (INDEPENDENT_AMBULATORY_CARE_PROVIDER_SITE_OTHER): Payer: BLUE CROSS/BLUE SHIELD | Admitting: Emergency Medicine

## 2017-02-16 VITALS — BP 110/70 | HR 74 | Temp 98.5°F | Wt 232.0 lb

## 2017-02-16 DIAGNOSIS — Z87898 Personal history of other specified conditions: Secondary | ICD-10-CM | POA: Diagnosis not present

## 2017-02-16 DIAGNOSIS — B353 Tinea pedis: Secondary | ICD-10-CM

## 2017-02-16 DIAGNOSIS — R0789 Other chest pain: Secondary | ICD-10-CM | POA: Diagnosis not present

## 2017-02-16 DIAGNOSIS — F1011 Alcohol abuse, in remission: Secondary | ICD-10-CM

## 2017-02-16 DIAGNOSIS — M67431 Ganglion, right wrist: Secondary | ICD-10-CM | POA: Insufficient documentation

## 2017-02-16 MED ORDER — CLOTRIMAZOLE-BETAMETHASONE 1-0.05 % EX CREA: 1.0000 "application " | TOPICAL_CREAM | Freq: Two times a day (BID) | CUTANEOUS | 0 refills | Status: DC

## 2017-02-16 NOTE — Progress Notes (Signed)
Daniel Phelps 31 y.o.   Chief Complaint  Patient presents with  . Chest Pain    WITH TIGHTNESS x 29month at bedtime and daytime  . Mass    right hand on back    HISTORY OF PRESENT ILLNESS: This is a 31 y.o. male complaining of intermittent episodes of chest pain x 1-2 months; not related to physical activity; sometimes short-lived and sometimes last all day; no associated symptoms and no radiation; non-smoker; daily ETOH user/abuser; has no cardiac risk factors. Also c/o mass to right wrist, getting bigger and now starting to hurt.  HPI   Prior to Admission medications   Medication Sig Start Date End Date Taking? Authorizing Provider  traMADol (ULTRAM) 50 MG tablet Take 1 tablet (50 mg total) by mouth every 6 (six) hours as needed for moderate pain. Patient not taking: Reported on 02/16/2017 10/15/16   Hayden Rasmussen, NP    Allergies  Allergen Reactions  . Ibuprofen Other (See Comments)    Sends pain down spine    Patient Active Problem List   Diagnosis Date Noted  . HNP (herniated nucleus pulposus), lumbar 07/04/2014    Past Medical History:  Diagnosis Date  . Anxiety   . Chronic back pain   . Depression   . GERD (gastroesophageal reflux disease)     Past Surgical History:  Procedure Laterality Date  . HERNIA REPAIR    . LUMBAR LAMINECTOMY/DECOMPRESSION MICRODISCECTOMY N/A 07/04/2014   Procedure: LUMBAR LAMINECTOMY/DECOMPRESSION MICRODISCECTOMY 1 LEVEL,LEFT LUMBAR FIVE-SACRAL ONE;  Surgeon: Coletta Memos, MD;  Location: [facility] OR;  Service: Neurosurgery;  Laterality: N/A;  left  . SPINE SURGERY  07/05/2015    Social History   Social History  . Marital status: Married    Spouse name: N/A  . Number of children: N/A  . Years of education: N/A   Occupational History  . Not on file.   Social History Main Topics  . Smoking status: Never Smoker  . Smokeless tobacco: Never Used  . Alcohol use Yes     Comment: 3-4 times per week; last used first week of November 2015  .  Drug use: Yes    Types: Marijuana     Comment: last used first week of November 2015  . Sexual activity: Not on file   Other Topics Concern  . Not on file   Social History Narrative  . No narrative on file    Family History  Problem Relation Age of Onset  . Other Mother   . Hernia Father      Review of Systems  Constitutional: Negative.  Negative for chills and fever.  HENT: Negative.   Eyes: Negative.   Respiratory: Negative for cough and shortness of breath.   Cardiovascular: Positive for chest pain and palpitations. Negative for leg swelling.  Gastrointestinal: Positive for abdominal pain (chronic LUQ pain). Negative for diarrhea, nausea and vomiting.  Genitourinary: Negative for dysuria and hematuria.  Musculoskeletal: Positive for joint pain (right wrist).  Skin: Positive for rash (left foot).  Neurological: Negative.  Negative for dizziness, sensory change, focal weakness and headaches.  Endo/Heme/Allergies: Negative.   All other systems reviewed and are negative.    Vitals:   02/16/17 1429  BP: 110/70  Pulse: 74  Temp: 98.5 F (36.9 C)     Physical Exam  Constitutional: He is oriented to person, place, and time. He appears well-developed and well-nourished.  HENT:  Head: Normocephalic and atraumatic.  Nose: Nose normal.  Mouth/Throat: Oropharynx is clear and  moist. No oropharyngeal exudate.  Eyes: Conjunctivae and EOM are normal. Pupils are equal, round, and reactive to light.  Neck: Normal range of motion. Neck supple. No JVD present. No thyromegaly present.  Cardiovascular: Normal rate, regular rhythm, normal heart sounds and intact distal pulses.   Pulmonary/Chest: Effort normal and breath sounds normal.  Abdominal: Soft. Bowel sounds are normal. He exhibits no distension and no mass. There is no tenderness. There is no rebound and no guarding.  Musculoskeletal: Normal range of motion.  Right wrist: +dorsal ganglionic cyst  Lymphadenopathy:    He has  no cervical adenopathy.  Neurological: He is alert and oriented to person, place, and time. He displays normal reflexes. No sensory deficit. He exhibits normal muscle tone.  Skin: Skin is warm and dry. Rash (left foot: +ringworm solitary lesion) noted.  Vitals reviewed.  EKG: NSR, no acute ischemic changes.  ASSESSMENT & PLAN: Doyle was seen today for chest pain and mass.  Diagnoses and all orders for this visit:  Atypical chest pain -     EKG 12-Lead  Ganglion cyst of dorsum of right wrist -     Ambulatory referral to Hand Surgery  Tinea pedis of left foot  H/O ETOH abuse  Other orders -     clotrimazole-betamethasone (LOTRISONE) cream; Apply 1 application topically 2 (two) times daily.    Patient Instructions   pc    IF you received an x-ray today, you will receive an invoice from [facility]. Please contact [facility] at [phone] with questions or concerns regarding your invoice.   IF you received labwork today, you will receive an invoice from [place]. Please contact [facility] at [phone] with questions or concerns regarding your invoice.   Our billing staff will not be able to assist you with questions regarding bills from these companies.  You will be contacted with the lab results as soon as they are available. The fastest way to get your results is to activate your My Chart account. Instructions are located on the last page of this paperwork. If you have not heard from [place] regarding the results in 2 weeks, please contact this office.     Ganglion Cyst A ganglion cyst is a noncancerous, fluid-filled lump that occurs near joints or tendons. The ganglion cyst grows out of a joint or the lining of a tendon. It most often develops in the hand or wrist, but it can also develop in the shoulder, elbow, hip, knee, ankle, or foot. The round or oval ganglion cyst can be the size of a pea or larger than a grape. Increased activity may enlarge the size of  the cyst because more fluid starts to build up. What are the causes? It is not known what causes a ganglion cyst to grow. However, it may be related to:  Inflammation or irritation around the joint.  An injury.  Repetitive movements or overuse.  Arthritis.  What increases the risk? Risk factors include:  Being a woman.  Being age 24-50.  What are the signs or symptoms? Symptoms may include:  A lump. This most often appears on the hand or wrist, but it can occur in other areas of the body.  Tingling.  Pain.  Numbness.  Muscle weakness.  Weak grip.  Less movement in a joint.  How is this diagnosed? Ganglion cysts are most often diagnosed based on a physical exam. Your health care provider will feel the lump and may shine a light alongside it. If it is a  ganglion cyst, a light often shines through it. Your health care provider may order an X-ray, ultrasound, or MRI to rule out other conditions. How is this treated? Ganglion cysts usually go away on their own without treatment. If pain or other symptoms are involved, treatment may be needed. Treatment is also needed if the ganglion cyst limits your movement or if it gets infected. Treatment may include:  Wearing a brace or splint on your wrist or finger.  Taking anti-inflammatory medicine.  Draining fluid from the lump with a needle (aspiration).  Injecting a steroid into the joint.  Surgery to remove the ganglion cyst.  Follow these instructions at home:  Do not press on the ganglion cyst, poke it with a needle, or hit it.  Take medicines only as directed by your health care provider.  Wear your brace or splint as directed by your health care provider.  Watch your ganglion cyst for any changes.  Keep all follow-up visits as directed by your health care provider. This is important. Contact a health care provider if:  Your ganglion cyst becomes larger or more painful.  You have increased redness, red  streaks, or swelling.  You have pus coming from the lump.  You have weakness or numbness in the affected area.  You have a fever or chills. This information is not intended to replace advice given to you by your health care provider. Make sure you discuss any questions you have with your health care provider. Document Released: 07/25/2000 Document Revised: 01/03/2016 Document Reviewed: 01/10/2014 Elsevier Interactive Patient Education  2018 Elsevier Inc.  Nonspecific Chest Pain Chest pain can be caused by many different conditions. There is a chance that your pain could be related to something serious, such as a heart attack or a blood clot in your lungs. Chest pain can also be caused by conditions that are not life-threatening. If you have chest pain, it is very important to follow up with your doctor. Follow these instructions at home: Medicines  If you were prescribed an antibiotic medicine, take it as told by your doctor. Do not stop taking the antibiotic even if you start to feel better.  Take over-the-counter and prescription medicines only as told by your doctor. Lifestyle  Do not use any products that contain nicotine or tobacco, such as cigarettes and e-cigarettes. If you need help quitting, ask your doctor.  Do not drink alcohol.  Make lifestyle changes as told by your doctor. These may include: ? Getting regular exercise. Ask your doctor for some activities that are safe for you. ? Eating a heart-healthy diet. A diet specialist (dietitian) can help you to learn healthy eating options. ? Staying at a healthy weight. ? Managing diabetes, if needed. ? Lowering your stress, as with deep breathing or spending time in nature. General instructions  Avoid any activities that make you feel chest pain.  If your chest pain is because of heartburn: ? Raise (elevate) the head of your bed about 6 inches (15 cm). You can do this by putting blocks under the bed legs at the head of the  bed. ? Do not sleep with extra pillows under your head. That does not help heartburn.  Keep all follow-up visits as told by your doctor. This is important. This includes any further testing if your chest pain does not go away. Contact a doctor if:  Your chest pain does not go away.  You have a rash with blisters on your chest.  You have a  fever.  You have chills. Get help right away if:  Your chest pain is worse.  You have a cough that gets worse, or you cough up blood.  You have very bad (severe) pain in your belly (abdomen).  You are very weak.  You pass out (faint).  You have either of these for no clear reason: ? Sudden chest discomfort. ? Sudden discomfort in your arms, back, neck, or jaw.  You have shortness of breath at any time.  You suddenly start to sweat, or your skin gets clammy.  You feel sick to your stomach (nauseous).  You throw up (vomit).  You suddenly feel light-headed or dizzy.  Your heart starts to beat fast, or it feels like it is skipping beats. These symptoms may be an emergency. Do not wait to see if the symptoms will go away. Get medical help right away. Call your local emergency services (911 in the [place] Do not drive yourself to the hospital. This information is not intended to replace advice given to you by your health care provider. Make sure you discuss any questions you have with your health care provider. Document Released: 01/14/2008 Document Revised: 04/21/2016 Document Reviewed: 04/21/2016 Elsevier Interactive Patient Education  2017 Elsevier Inc.  Body Ringworm Body ringworm is an infection of the skin that often causes a ring-shaped rash. Body ringworm can affect any part of your skin. It can spread easily to others. Body ringworm is also called tinea corporis. What are the causes? This condition is caused by funguses called dermatophytes. The condition develops when these funguses grow out of control on the skin. You can get  this condition if you touch a person or animal that has it. You can also get it if you share clothing, bedding, towels, or any other object with an infected person or pet. What increases the risk? This condition is more likely to develop in:  Athletes who often make skin-to-skin contact with other athletes, such as wrestlers.  People who share equipment and mats.  People with a weakened immune system.  What are the signs or symptoms? Symptoms of this condition include:  Itchy, raised red spots and bumps.  Red scaly patches.  A ring-shaped rash. The rash may have: ? A clear center. ? Scales or red bumps at its center. ? Redness near its borders. ? Dry and scaly skin on or around it.  How is this diagnosed? This condition can usually be diagnosed with a skin exam. A skin scraping may be taken from the affected area and examined under a microscope to see if the fungus is present. How is this treated? This condition may be treated with:  An antifungal cream or ointment.  An antifungal shampoo.  Antifungal medicines. These may be prescribed if your ringworm is severe, keeps coming back, or lasts a long time.  Follow these instructions at home:  Take over-the-counter and prescription medicines only as told by your health care provider.  If you were given an antifungal cream or ointment: ? Use it as told by your health care provider. ? Wash the infected area and dry it completely before applying the cream or ointment.  If you were given an antifungal shampoo: ? Use it as told by your health care provider. ? Leave the shampoo on your body for 3-5 minutes before rinsing.  While you have a rash: ? Wear loose clothing to stop clothes from rubbing and irritating it. ? Wash or change your bed sheets every night.  If your pet has the same infection, take your pet to see a International aid/development worker. How is this prevented?  Practice good hygiene.  Wear sandals or shoes in public places and  showers.  Do not share personal items with others.  Avoid touching red patches of skin on other people.  Avoid touching pets that have bald spots.  If you touch an animal that has a bald spot, wash your hands. Contact a health care provider if:  Your rash continues to spread after 7 days of treatment.  Your rash is not gone in 4 weeks.  The area around your rash gets red, warm, tender, and swollen. This information is not intended to replace advice given to you by your health care provider. Make sure you discuss any questions you have with your health care provider. Document Released: 07/25/2000 Document Revised: 01/03/2016 Document Reviewed: 05/24/2015 Elsevier Interactive Patient Education  2018 Elsevier Inc.       Edwina Barth, MD [facility]

## 2017-02-16 NOTE — Patient Instructions (Addendum)
pc    IF you received an x-ray today, you will receive an invoice from [facility]. Please contact [facility] at [phone] with questions or concerns regarding your invoice.   IF you received labwork today, you will receive an invoice from [place]. Please contact [facility] at [phone] with questions or concerns regarding your invoice.   Our billing staff will not be able to assist you with questions regarding bills from these companies.  You will be contacted with the lab results as soon as they are available. The fastest way to get your results is to activate your My Chart account. Instructions are located on the last page of this paperwork. If you have not heard from [place] regarding the results in 2 weeks, please contact this office.     Ganglion Cyst A ganglion cyst is a noncancerous, fluid-filled lump that occurs near joints or tendons. The ganglion cyst grows out of a joint or the lining of a tendon. It most often develops in the hand or wrist, but it can also develop in the shoulder, elbow, hip, knee, ankle, or foot. The round or oval ganglion cyst can be the size of a pea or larger than a grape. Increased activity may enlarge the size of the cyst because more fluid starts to build up. What are the causes? It is not known what causes a ganglion cyst to grow. However, it may be related to:  Inflammation or irritation around the joint.  An injury.  Repetitive movements or overuse.  Arthritis.  What increases the risk? Risk factors include:  Being a woman.  Being age 46-50.  What are the signs or symptoms? Symptoms may include:  A lump. This most often appears on the hand or wrist, but it can occur in other areas of the body.  Tingling.  Pain.  Numbness.  Muscle weakness.  Weak grip.  Less movement in a joint.  How is this diagnosed? Ganglion cysts are most often diagnosed based on a physical exam. Your health care provider will feel the  lump and may shine a light alongside it. If it is a ganglion cyst, a light often shines through it. Your health care provider may order an X-ray, ultrasound, or MRI to rule out other conditions. How is this treated? Ganglion cysts usually go away on their own without treatment. If pain or other symptoms are involved, treatment may be needed. Treatment is also needed if the ganglion cyst limits your movement or if it gets infected. Treatment may include:  Wearing a brace or splint on your wrist or finger.  Taking anti-inflammatory medicine.  Draining fluid from the lump with a needle (aspiration).  Injecting a steroid into the joint.  Surgery to remove the ganglion cyst.  Follow these instructions at home:  Do not press on the ganglion cyst, poke it with a needle, or hit it.  Take medicines only as directed by your health care provider.  Wear your brace or splint as directed by your health care provider.  Watch your ganglion cyst for any changes.  Keep all follow-up visits as directed by your health care provider. This is important. Contact a health care provider if:  Your ganglion cyst becomes larger or more painful.  You have increased redness, red streaks, or swelling.  You have pus coming from the lump.  You have weakness or numbness in the affected area.  You have a fever or chills. This information is not intended to replace advice given to you by  your health care provider. Make sure you discuss any questions you have with your health care provider. Document Released: 07/25/2000 Document Revised: 01/03/2016 Document Reviewed: 01/10/2014 Elsevier Interactive Patient Education  2018 Elsevier Inc.  Nonspecific Chest Pain Chest pain can be caused by many different conditions. There is a chance that your pain could be related to something serious, such as a heart attack or a blood clot in your lungs. Chest pain can also be caused by conditions that are not life-threatening.  If you have chest pain, it is very important to follow up with your doctor. Follow these instructions at home: Medicines  If you were prescribed an antibiotic medicine, take it as told by your doctor. Do not stop taking the antibiotic even if you start to feel better.  Take over-the-counter and prescription medicines only as told by your doctor. Lifestyle  Do not use any products that contain nicotine or tobacco, such as cigarettes and e-cigarettes. If you need help quitting, ask your doctor.  Do not drink alcohol.  Make lifestyle changes as told by your doctor. These may include: ? Getting regular exercise. Ask your doctor for some activities that are safe for you. ? Eating a heart-healthy diet. A diet specialist (dietitian) can help you to learn healthy eating options. ? Staying at a healthy weight. ? Managing diabetes, if needed. ? Lowering your stress, as with deep breathing or spending time in nature. General instructions  Avoid any activities that make you feel chest pain.  If your chest pain is because of heartburn: ? Raise (elevate) the head of your bed about 6 inches (15 cm). You can do this by putting blocks under the bed legs at the head of the bed. ? Do not sleep with extra pillows under your head. That does not help heartburn.  Keep all follow-up visits as told by your doctor. This is important. This includes any further testing if your chest pain does not go away. Contact a doctor if:  Your chest pain does not go away.  You have a rash with blisters on your chest.  You have a fever.  You have chills. Get help right away if:  Your chest pain is worse.  You have a cough that gets worse, or you cough up blood.  You have very bad (severe) pain in your belly (abdomen).  You are very weak.  You pass out (faint).  You have either of these for no clear reason: ? Sudden chest discomfort. ? Sudden discomfort in your arms, back, neck, or jaw.  You have  shortness of breath at any time.  You suddenly start to sweat, or your skin gets clammy.  You feel sick to your stomach (nauseous).  You throw up (vomit).  You suddenly feel light-headed or dizzy.  Your heart starts to beat fast, or it feels like it is skipping beats. These symptoms may be an emergency. Do not wait to see if the symptoms will go away. Get medical help right away. Call your local emergency services (911 in the [place] Do not drive yourself to the hospital. This information is not intended to replace advice given to you by your health care provider. Make sure you discuss any questions you have with your health care provider. Document Released: 01/14/2008 Document Revised: 04/21/2016 Document Reviewed: 04/21/2016 Elsevier Interactive Patient Education  2017 Elsevier Inc.  Body Ringworm Body ringworm is an infection of the skin that often causes a ring-shaped rash. Body ringworm can affect any part of  your skin. It can spread easily to others. Body ringworm is also called tinea corporis. What are the causes? This condition is caused by funguses called dermatophytes. The condition develops when these funguses grow out of control on the skin. You can get this condition if you touch a person or animal that has it. You can also get it if you share clothing, bedding, towels, or any other object with an infected person or pet. What increases the risk? This condition is more likely to develop in:  Athletes who often make skin-to-skin contact with other athletes, such as wrestlers.  People who share equipment and mats.  People with a weakened immune system.  What are the signs or symptoms? Symptoms of this condition include:  Itchy, raised red spots and bumps.  Red scaly patches.  A ring-shaped rash. The rash may have: ? A clear center. ? Scales or red bumps at its center. ? Redness near its borders. ? Dry and scaly skin on or around it.  How is this diagnosed? This  condition can usually be diagnosed with a skin exam. A skin scraping may be taken from the affected area and examined under a microscope to see if the fungus is present. How is this treated? This condition may be treated with:  An antifungal cream or ointment.  An antifungal shampoo.  Antifungal medicines. These may be prescribed if your ringworm is severe, keeps coming back, or lasts a long time.  Follow these instructions at home:  Take over-the-counter and prescription medicines only as told by your health care provider.  If you were given an antifungal cream or ointment: ? Use it as told by your health care provider. ? Wash the infected area and dry it completely before applying the cream or ointment.  If you were given an antifungal shampoo: ? Use it as told by your health care provider. ? Leave the shampoo on your body for 3-5 minutes before rinsing.  While you have a rash: ? Wear loose clothing to stop clothes from rubbing and irritating it. ? Wash or change your bed sheets every night.  If your pet has the same infection, take your pet to see a International aid/development workerveterinarian. How is this prevented?  Practice good hygiene.  Wear sandals or shoes in public places and showers.  Do not share personal items with others.  Avoid touching red patches of skin on other people.  Avoid touching pets that have bald spots.  If you touch an animal that has a bald spot, wash your hands. Contact a health care provider if:  Your rash continues to spread after 7 days of treatment.  Your rash is not gone in 4 weeks.  The area around your rash gets red, warm, tender, and swollen. This information is not intended to replace advice given to you by your health care provider. Make sure you discuss any questions you have with your health care provider. Document Released: 07/25/2000 Document Revised: 01/03/2016 Document Reviewed: 05/24/2015 Elsevier Interactive Patient Education  [employer]2018 Elsevier Inc.

## 2018-01-29 ENCOUNTER — Encounter: Payer: Self-pay | Admitting: Family Medicine

## 2018-01-29 ENCOUNTER — Other Ambulatory Visit: Payer: Self-pay

## 2018-01-29 ENCOUNTER — Ambulatory Visit (INDEPENDENT_AMBULATORY_CARE_PROVIDER_SITE_OTHER): Payer: BLUE CROSS/BLUE SHIELD | Admitting: Family Medicine

## 2018-01-29 ENCOUNTER — Telehealth: Payer: Self-pay | Admitting: Family Medicine

## 2018-01-29 VITALS — BP 132/88 | HR 68 | Temp 97.7°F | Resp 14 | Ht 73.0 in | Wt 230.0 lb

## 2018-01-29 DIAGNOSIS — H6093 Unspecified otitis externa, bilateral: Secondary | ICD-10-CM | POA: Diagnosis not present

## 2018-01-29 DIAGNOSIS — G8929 Other chronic pain: Secondary | ICD-10-CM | POA: Insufficient documentation

## 2018-01-29 DIAGNOSIS — M549 Dorsalgia, unspecified: Secondary | ICD-10-CM

## 2018-01-29 DIAGNOSIS — Z7689 Persons encountering health services in other specified circumstances: Secondary | ICD-10-CM

## 2018-01-29 DIAGNOSIS — Z13220 Encounter for screening for lipoid disorders: Secondary | ICD-10-CM | POA: Diagnosis not present

## 2018-01-29 DIAGNOSIS — G4710 Hypersomnia, unspecified: Secondary | ICD-10-CM | POA: Diagnosis not present

## 2018-01-29 MED ORDER — AMPHETAMINE-DEXTROAMPHETAMINE 10 MG PO TABS: 10.0000 mg | ORAL_TABLET | Freq: Two times a day (BID) | ORAL | 0 refills | Status: DC

## 2018-01-29 MED ORDER — NEOMYCIN-POLYMYXIN-HC 3.5-10000-1 OT SOLN: 4.0000 [drp] | Freq: Four times a day (QID) | OTIC | 0 refills | Status: AC

## 2018-01-29 MED ORDER — AMPHETAMINE-DEXTROAMPHETAMINE 10 MG PO TABS
10.0000 mg | ORAL_TABLET | Freq: Two times a day (BID) | ORAL | 0 refills | Status: DC
Start: 2018-01-29 — End: 2018-01-29

## 2018-01-29 NOTE — Addendum Note (Signed)
Addended by: Danelle BerryAPIA, Aleph Nickson on: 01/29/2018 02:50 PM   Modules accepted: Orders

## 2018-01-29 NOTE — Progress Notes (Addendum)
Patient ID: Daniel Phelps, male    DOB: 1986-02-08, 32 y.o.   MRN: [id number]  PCP: Danelle Berry, PA-C  Chief Complaint  Patient presents with  . New Patient (Initial Visit)    trouble with sleep     Subjective:   Daniel Phelps is a 32 y.o. male, he is new to establish care here presents with a chief complaint of excessive sleeping with daytime sleepiness when he is awake.  The symptoms have been ongoing for many many years.  He states that since he was a teenager he has always slept at least 12 hours every night.  He is come in today because his wife is getting upset with him with how he is always asleep.  On average she states that he goes to bed at 6 pm, and wakes up at 11 am.  He states that he "works for himself" and so he can sleep as long as he needs to or wants to.  He states that when he has to work he will drive to [facility] or [place].  He can make himself wake up at earlier hours like 6 or 7 AM.  But regardless of whether he has slept in late or gotten up to go to work he has trouble staying awake while driving and other times other day.  States that his past PCP was Dr. Loma Sender in [facility] but he has since passed away and he has not been to the doctor or been established for some time.  States that Dr. Vear Clock was managing his sleepiness with Adderall.  Adderall would help him stay awake once he woke up and prevented him from dozing off and falling asleep like he normally does.  He denies any blood work, tests, sleep studies, referrals to any specialist.  He noted that he became very irritable and did not tolerate the side effects when they Increasing his dose, he mentions a extended release 30 mg dose that he did not do well with.  He comes in bringing a medicine on his phone that he would like to try called modafinil.    He states that his sleepiness is not associated by any other symptoms including no rapid heart rate, shortness of breath, chest pain, passing  out episodes, lightheadedness, dizziness.  Has not had any significant or unintentional weight loss but he believes he is gained a small amount of weight from sleeping so much.  He has a 95-month-old baby boy at home and a 9 and half-year-old son as well, his wife is upset with him that he sleeps all day.  He denies any depression or anxiety about this.  PHQ 9 screening is completely negative.  He denies any illegal drug use, daily alcohol use.    He also complains of right ear pain that was evaluated in urgent care more than 2 years ago he states that he continues to have the same ear pain and he never got the drops that he was prescribed.  He states that he can feel something in there and a bump when he pushes a Q-tip inside of his ear.  It does not feel the same on his left ear.  He denies any itching sensations or allergies.  He has had no drainage or swelling or redness of his ears.  But he states that it is burns and feels inflamed and irritated.  He has a history herniated disc of his low back with back surgery  November 2015 and has mild chronic back pain.   He denies any other known current or past medical conditions or diagnoses.   He ate breakfast this morning and will return for remaining lab work and for a complete physical in 1 to 2 weeks.   Patient Active Problem List   Diagnosis Date Noted  . Chronic back pain 01/29/2018  . HNP (herniated nucleus pulposus), lumbar 07/04/2014     Prior to Admission medications   Not on File     Allergies  Allergen Reactions  . Ibuprofen Other (See Comments)    Sends pain down spine     Family History  Problem Relation Age of Onset  . Other Mother   . Hernia Father      Social History   Socioeconomic History  . Marital status: Married    Spouse name: Not on file  . Number of children: Not on file  . Years of education: Not on file  . Highest education level: Not on file  Occupational History  . Not on file  Social Needs  .  Financial resource strain: Not on file  . Food insecurity:    Worry: Not on file    Inability: Not on file  . Transportation needs:    Medical: Not on file    Non-medical: Not on file  Tobacco Use  . Smoking status: Never Smoker  . Smokeless tobacco: Never Used  Substance and Sexual Activity  . Alcohol use: Not Currently    Comment: 3-4 times per week; last used first week of November 2015  . Drug use: Not Currently    Types: Marijuana    Comment: last used first week of November 2015  . Sexual activity: Not on file  Lifestyle  . Physical activity:    Days per week: Not on file    Minutes per session: Not on file  . Stress: Not on file  Relationships  . Social connections:    Talks on phone: Not on file    Gets together: Not on file    Attends religious service: Not on file    Active member of club or organization: Not on file    Attends meetings of clubs or organizations: Not on file    Relationship status: Not on file  . Intimate partner violence:    Fear of current or ex partner: Not on file    Emotionally abused: Not on file    Physically abused: Not on file    Forced sexual activity: Not on file  Other Topics Concern  . Not on file  Social History Narrative  . Not on file     Review of Systems  Constitutional: Negative.  Negative for activity change, appetite change, chills, diaphoresis, fever and unexpected weight change.  HENT: Positive for ear pain. Negative for congestion, ear discharge, facial swelling, postnasal drip, rhinorrhea, sinus pressure, sinus pain, sneezing, sore throat, tinnitus, trouble swallowing and voice change.   Eyes: Negative.   Respiratory: Negative.   Cardiovascular: Negative.  Negative for chest pain, palpitations and leg swelling.  Gastrointestinal: Negative.  Negative for abdominal distention, abdominal pain, blood in stool and nausea.  Endocrine: Negative.  Negative for polydipsia, polyphagia and polyuria.  Genitourinary: Negative.     Musculoskeletal: Positive for back pain. Negative for arthralgias (chronic), gait problem, joint swelling, myalgias, neck pain and neck stiffness.  Skin: Negative.  Negative for color change and pallor.  Allergic/Immunologic: Negative.   Neurological: Negative for dizziness, tremors, seizures,  facial asymmetry, speech difficulty, weakness, light-headedness, numbness and headaches.  Psychiatric/Behavioral: Positive for decreased concentration and sleep disturbance. Negative for behavioral problems, confusion, dysphoric mood, hallucinations, self-injury and suicidal ideas. The patient is not nervous/anxious and is not hyperactive.   All other systems reviewed and are negative.      Objective:    Vitals:   01/29/18 1036  BP: 132/88  Pulse: 68  Resp: 14  Temp: 97.7 F (36.5 C)  TempSrc: Oral  SpO2: 97%  Weight: 230 lb (104.3 kg)  Height: 6\' 1"  (1.854 m)      Physical Exam  Constitutional: He is oriented to person, place, and time. He appears well-developed and well-nourished.  Non-toxic appearance. He does not appear ill. No distress.  HENT:  Head: Normocephalic and atraumatic.  Right Ear: Hearing, tympanic membrane and external ear normal.  Left Ear: Hearing, tympanic membrane and external ear normal.  Nose: Nose normal. No mucosal edema or rhinorrhea. Right sinus exhibits no maxillary sinus tenderness and no frontal sinus tenderness. Left sinus exhibits no maxillary sinus tenderness and no frontal sinus tenderness.  Mouth/Throat: Uvula is midline and oropharynx is clear and moist. No trismus in the jaw. No uvula swelling. No oropharyngeal exudate, posterior oropharyngeal edema or posterior oropharyngeal erythema.  mild bilateral external auditory canal erythema and very mild edema to inner half of canal, no discharge, purulence, no cerumen, TMs normal  Eyes: Pupils are equal, round, and reactive to light. Conjunctivae, EOM and lids are normal. Right eye exhibits no discharge. Left  eye exhibits no discharge. No scleral icterus. Right eye exhibits normal extraocular motion. Left eye exhibits normal extraocular motion.  Neck: Trachea normal, normal range of motion and phonation normal. Neck supple. No tracheal deviation present. No thyromegaly present.  Cardiovascular: Normal rate, regular rhythm, normal heart sounds and normal pulses. Exam reveals no gallop and no friction rub.  No murmur heard. Pulses:      Radial pulses are 2+ on the right side, and 2+ on the left side.       Posterior tibial pulses are 2+ on the right side, and 2+ on the left side.  Pulmonary/Chest: Effort normal and breath sounds normal. He has no wheezes. He has no rhonchi. He has no rales.  Abdominal: Soft. Normal appearance and bowel sounds are normal. He exhibits no distension. There is no tenderness. There is no rebound and no guarding.  Musculoskeletal: Normal range of motion. He exhibits no edema.  Neurological: He is alert and oriented to person, place, and time. He has normal strength. He displays no atrophy and no tremor. No sensory deficit. He exhibits normal muscle tone. He displays no seizure activity. Coordination and gait normal.  Skin: Skin is warm, dry and intact. Capillary refill takes less than 2 seconds. No rash noted. He is not diaphoretic.  Psychiatric: He has a normal mood and affect. His speech is normal and behavior is normal. His affect is not labile. Cognition and memory are normal. He does not exhibit a depressed mood. He expresses no homicidal and no suicidal ideation. He expresses no suicidal plans and no homicidal plans.  Patient attentive, no somnolence He is attentive.  Nursing note and vitals reviewed.         Assessment & Plan:      ICD-10-CM   1. Sleeping excessive G47.10 Testosterone    TSH    CBC with Differential/Platelet    COMPLETE METABOLIC PANEL WITH GFR    Ambulatory referral to Neurology   basic  labs with TSH and testosterone.  will refer to neuro for  sleep studies and work up.    2. Encounter to establish care with new doctor Z76.89    request records from past PCP  3. Otitis externa of both ears, unspecified chronicity, unspecified type H60.93 neomycin-polymyxin-hydrocortisone (CORTISPORIN) OTIC solution  4. Screening for hyperlipidemia Z13.220 Lipid Panel    Trying to obtain records from patient's PCP, also trying to confirm any past Adderall or stimulant use to treat excessive sleeping or sleeping disorder.  Basic labs obtained, screening for anemia, kidney function, thyroid disease, low T.  Patient will return later when fasting to obtain a lipid panel, and in 1 to 2 weeks will be following up for complete physical exam.  He has mild irritation to bilateral ears, he was instructed to stop using Q-tips and use allergy medicine as needed.  Will cover with Cortisporin drops    Danelle Berry, PA-C 01/29/18 1:38 PM  16:53: Was able to verify history of prescriptions of Adderall from December 2015 to October 2016, in varying doses, also initially was treated for 1 month with methylphenidate.  Sent to his requested pharmacy, 10 mg generic Adderall to take once in the morning and repeat at lunch as needed to assist with keeping awake. 4:54 PM Had to send Rx to a different pharmacy, out of stock at [facility] on [address].

## 2018-01-29 NOTE — Addendum Note (Signed)
Addended by: Danelle BerryAPIA, Saleh Ulbrich on: 01/29/2018 04:55 PM   Modules accepted: Orders

## 2018-01-29 NOTE — Telephone Encounter (Signed)
Spoke with patient and he stated that [facility] and that it was 30 MG daily. [facility] and they stated that he last had Adderall filled in 2016 and he was taking Adderral 20 MG 1 tablet daily. Please advise?

## 2018-01-30 LAB — COMPLETE METABOLIC PANEL WITH GFR
AG Ratio: 1.9 (calc) (ref 1.0–2.5)
ALKALINE PHOSPHATASE (APISO): 60 U/L (ref 40–115)
ALT: 77 U/L — AB (ref 9–46)
AST: 37 U/L (ref 10–40)
Albumin: 4.9 g/dL (ref 3.6–5.1)
BUN: 15 mg/dL (ref 7–25)
CO2: 26 mmol/L (ref 20–32)
Calcium: 9.9 mg/dL (ref 8.6–10.3)
Chloride: 102 mmol/L (ref 98–110)
Creat: 1.07 mg/dL (ref 0.60–1.35)
GFR, EST NON AFRICAN AMERICAN: 91 mL/min/{1.73_m2} (ref >=60)
GFR, Est African American: 106 mL/min/{1.73_m2} (ref >=60)
GLOBULIN: 2.6 g/dL (ref 1.9–3.7)
GLUCOSE: 106 mg/dL — AB (ref 65–99)
Potassium: 4.4 mmol/L (ref 3.5–5.3)
Sodium: 137 mmol/L (ref 135–146)
Total Bilirubin: 0.7 mg/dL (ref 0.2–1.2)
Total Protein: 7.5 g/dL (ref 6.1–8.1)

## 2018-01-30 LAB — CBC WITH DIFFERENTIAL/PLATELET
Basophils Absolute: 23 cells/uL (ref 0–200)
Basophils Relative: 0.3 %
EOS PCT: 1.7 %
Eosinophils Absolute: 133 cells/uL (ref 15–500)
HEMATOCRIT: 47.4 % (ref 38.5–50.0)
Hemoglobin: 16.2 g/dL (ref 13.2–17.1)
LYMPHS ABS: 1724 {cells}/uL (ref 850–3900)
MCH: 30 pg (ref 27.0–33.0)
MCHC: 34.2 g/dL (ref 32.0–36.0)
MCV: 87.8 fL (ref 80.0–100.0)
MONOS PCT: 7.5 %
MPV: 10.1 fL (ref 7.5–12.5)
NEUTROS PCT: 68.4 %
Neutro Abs: 5335 cells/uL (ref 1500–7800)
Platelets: 271 10*3/uL (ref 140–400)
RBC: 5.4 10*6/uL (ref 4.20–5.80)
RDW: 12.4 % (ref 11.0–15.0)
Total Lymphocyte: 22.1 %
WBC mixed population: 585 cells/uL (ref 200–950)
WBC: 7.8 10*3/uL (ref 3.8–10.8)

## 2018-01-30 LAB — TSH: TSH: 0.59 m[IU]/L (ref 0.40–4.50)

## 2018-01-30 LAB — TESTOSTERONE: TESTOSTERONE: 292 ng/dL (ref 250–827)

## 2018-02-04 NOTE — Progress Notes (Signed)
Please notify patient of lab work:  Thyroid and testosterone are normal, good kidney function, electrolytes and cell counts.  1 of his liver function tests is elevated (ALT) and it is more elevated than an abnormal lab 2 years ago.  Please have him follow up in office to discuss.

## 2018-02-12 ENCOUNTER — Encounter: Payer: BLUE CROSS/BLUE SHIELD | Admitting: Family Medicine

## 2018-02-22 ENCOUNTER — Telehealth: Payer: Self-pay | Admitting: Family Medicine

## 2018-02-22 NOTE — Telephone Encounter (Signed)
Spoke with patient to discuss referral to Neurology for sleep and informed him per his provider he will not get refills on Adderrall until he is seen to be evaluated for sleep. He states that he does not want to go through with the referral because it will more than likely cost him a lot and he also stated he will not require any more refills on his medication because he got irritated while taking it and threw it in the trash can. (please see Neurology Referral)

## 2018-02-23 ENCOUNTER — Encounter: Payer: Self-pay | Admitting: Family Medicine

## 2018-02-23 ENCOUNTER — Ambulatory Visit (INDEPENDENT_AMBULATORY_CARE_PROVIDER_SITE_OTHER): Payer: BLUE CROSS/BLUE SHIELD | Admitting: Family Medicine

## 2018-02-23 ENCOUNTER — Other Ambulatory Visit: Payer: Self-pay

## 2018-02-23 VITALS — BP 118/82 | HR 89 | Temp 97.8°F | Resp 20 | Ht 72.0 in | Wt 226.0 lb

## 2018-02-23 DIAGNOSIS — R739 Hyperglycemia, unspecified: Secondary | ICD-10-CM | POA: Diagnosis not present

## 2018-02-23 DIAGNOSIS — E669 Obesity, unspecified: Secondary | ICD-10-CM | POA: Diagnosis not present

## 2018-02-23 DIAGNOSIS — Z114 Encounter for screening for human immunodeficiency virus [HIV]: Secondary | ICD-10-CM | POA: Diagnosis not present

## 2018-02-23 DIAGNOSIS — R7989 Other specified abnormal findings of blood chemistry: Secondary | ICD-10-CM

## 2018-02-23 DIAGNOSIS — F32A Depression, unspecified: Secondary | ICD-10-CM

## 2018-02-23 DIAGNOSIS — Z0000 Encounter for general adult medical examination without abnormal findings: Secondary | ICD-10-CM | POA: Diagnosis not present

## 2018-02-23 DIAGNOSIS — G4710 Hypersomnia, unspecified: Secondary | ICD-10-CM

## 2018-02-23 DIAGNOSIS — R945 Abnormal results of liver function studies: Secondary | ICD-10-CM | POA: Diagnosis not present

## 2018-02-23 DIAGNOSIS — M549 Dorsalgia, unspecified: Secondary | ICD-10-CM

## 2018-02-23 DIAGNOSIS — F329 Major depressive disorder, single episode, unspecified: Secondary | ICD-10-CM | POA: Diagnosis not present

## 2018-02-23 DIAGNOSIS — G8929 Other chronic pain: Secondary | ICD-10-CM | POA: Diagnosis not present

## 2018-02-23 DIAGNOSIS — Z6830 Body mass index (BMI) 30.0-30.9, adult: Secondary | ICD-10-CM

## 2018-02-23 DIAGNOSIS — F1010 Alcohol abuse, uncomplicated: Secondary | ICD-10-CM | POA: Diagnosis not present

## 2018-02-23 NOTE — Progress Notes (Signed)
Patient: Daniel Phelps, Male    DOB: [birth year], 32 y.o.   MRN: [id number] Visit Date: 02/24/2018  Today's Provider: Danelle Berry, PA-C   Chief Complaint  Patient presents with  . Annual Exam   Subjective:    Annual physical exam Daniel Phelps is a 32 y.o. male who presents today for health maintenance and complete physical. He feels well. He reports exercising none - going to start exercising again. He reports he is sleeping well.  ----------------------------------------------------------------- Daniel Phelps presents for CPE, is a recent new patient of established nearly 1 month ago.  Previously addressed acute and chronic health issues reviewed today, patient has been noncompliant with most past orders, evaluation and treatment plan.  Excessive sleepiness.  He was referred to neurology for sleep study and work-up but he refuses to go.  He called the office at least 7 times immediately after his initial evaluation while he was trying to verify any past stimulant use from local pharmacies.  Eventually was able to verify past prescription of Adderall, and a low dose was prescribed for him.  He has called since that time stating that he would not go to the neurology evaluation.  Today he states that his sleeping is much better.  He believes he was avoiding his wife and children because of marital contention and stress in the home.  He states that when he wakes up earlier in the day he is no longer sleepy.  He states that he threw with Adderall in the trash can because of side effects.  He does not wish to pursue working this up any further.  Patient only completed some of his routine lab work ordered at his last appointment.  He has not done his fasting lipid panel.  CMP and CBC were done, had elevated ALT.  He refuses to repeat this lab today.  He states that he has been binge drinking on the weekends and last night he also drank excessive amounts of hard liquor.  He was advised that his fasting blood sugar  was mildly elevated and need to do an additional test for this, he refuses to do this lab as well stating that he wants to work on eating healthier diet and exercising, notes gradual weight gain over the past several years.  Patient does endorse that he feels down because he is unable to see his child, he has a 58-year-old who is his wife's child from another father and he does have a 65-month-old.  He states that since they left some of his sleeping has improved and he feels like he is using it to avoid them and avoid the contention in the home.  But since he left he does not really miss his son and that is making him somewhat sad and depressed.  He states that he would like a pill to help with this.  But he does not want to go talk to anyone if psychiatry or to do an evaluation or do any talk therapy or counseling.  They said that would make him feel like he is "weak".  And what he cannot handle himself he will "go to God to deal with." When I explained that most SSRIs and SNRIs for depression anxiety take 4 to 6 weeks to become effective patient did not want to try one.  He did deny any suicidal ideations, homicidal ideations, auditory visual hallucinations.  Patient also mentions chronic back pain that has not improved much for several years.  Would like  to be referred to pain management  Review of Systems  Constitutional: Negative.  Negative for activity change, appetite change and unexpected weight change.  HENT: Negative.   Eyes: Negative.   Respiratory: Negative.  Negative for chest tightness and shortness of breath.   Cardiovascular: Negative.  Negative for chest pain, palpitations and leg swelling.  Gastrointestinal: Negative.  Negative for abdominal pain, blood in stool, diarrhea, nausea and vomiting.  Endocrine: Negative.   Genitourinary: Negative.  Negative for decreased urine volume, difficulty urinating, testicular pain and urgency.  Musculoskeletal: Positive for arthralgias and back  pain. Negative for myalgias and neck stiffness.  Skin: Negative.  Negative for color change and pallor.  Allergic/Immunologic: Negative.   Neurological: Negative.  Negative for syncope, weakness, light-headedness and numbness.  Hematological: Negative.   Psychiatric/Behavioral: Negative.  Negative for confusion, dysphoric mood, hallucinations, self-injury and suicidal ideas. The patient is not nervous/anxious and is not hyperactive.   All other systems reviewed and are negative.      Social History      He  reports that he has never smoked. He has never used smokeless tobacco. He reports that he drank alcohol. He reports that he has current or past drug history. Drug: Marijuana.       Social History   Socioeconomic History  . Marital status: Married    Spouse name: Not on file  . Number of children: 2  . Years of education: Not on file  . Highest education level: Not on file  Occupational History  . Not on file  Social Needs  . Financial resource strain: Not on file  . Food insecurity:    Worry: Not on file    Inability: Not on file  . Transportation needs:    Medical: Not on file    Non-medical: Not on file  Tobacco Use  . Smoking status: Never Smoker  . Smokeless tobacco: Never Used  Substance and Sexual Activity  . Alcohol use: Yes    Alcohol/week: 3.6 oz    Types: 6 Shots of liquor per week    Comment: 3-4 times per week; last used first week of November 2015  . Drug use: Not Currently    Comment: Marijuana - last used first week of November 2015  . Sexual activity: Not Currently  Lifestyle  . Physical activity:    Days per week: 0 days    Minutes per session: Not on file  . Stress: Not on file  Relationships  . Social connections:    Talks on phone: Not on file    Gets together: Not on file    Attends religious service: Not on file    Active member of club or organization: Not on file    Attends meetings of clubs or organizations: Not on file     Relationship status: Not on file  Other Topics Concern  . Not on file  Social History Narrative  . Not on file    Past Medical History:  Diagnosis Date  . Anxiety   . Chronic back pain   . Depression   . GERD (gastroesophageal reflux disease)      Patient Active Problem List   Diagnosis Date Noted  . LFT elevation 02/24/2018  . Elevated blood sugar 02/24/2018  . Sleeping excessive 02/24/2018  . Class 1 obesity with body mass index (BMI) of 30.0 to 30.9 in adult 02/24/2018  . Depression 02/24/2018  . Alcohol consumption binge drinking 02/24/2018  . Chronic back pain 01/29/2018  .  HNP (herniated nucleus pulposus), lumbar 07/04/2014    Past Surgical History:  Procedure Laterality Date  . HERNIA REPAIR    . LUMBAR LAMINECTOMY/DECOMPRESSION MICRODISCECTOMY N/A 07/04/2014   Procedure: LUMBAR LAMINECTOMY/DECOMPRESSION MICRODISCECTOMY 1 LEVEL,LEFT LUMBAR FIVE-SACRAL ONE;  Surgeon: Coletta Memos, MD;  Location: [facility] OR;  Service: Neurosurgery;  Laterality: N/A;  left  . SPINE SURGERY  07/04/2014   Left L5 semihemilaminectomy and L5/S1 discetomy     Family History        Family Status  Relation Name Status  . Mother  Alive  . Father  Alive  . Sister  Alive  . MGM  Alive  . MGF  Alive  . PGM  Alive  . PGF  Alive        His family history includes Hernia in his father; Other in his mother.      Allergies  Allergen Reactions  . Ibuprofen Other (See Comments)    Sends pain down spine    No current outpatient medications on file.   Patient Care Team: Danelle Berry, PA-C as PCP - General (Family Medicine)      Objective:   Vitals: BP 118/82   Pulse 89   Temp 97.8 F (36.6 C) (Oral)   Resp 20   Ht 6' (1.829 m)   Wt 226 lb (102.5 kg)   SpO2 96%   BMI 30.65 kg/m    Vitals:   02/23/18 1008  BP: 118/82  Pulse: 89  Resp: 20  Temp: 97.8 F (36.6 C)  TempSrc: Oral  SpO2: 96%  Weight: 226 lb (102.5 kg)  Height: 6' (1.829 m)     Physical Exam    Constitutional: He is oriented to person, place, and time. He appears well-developed and well-nourished.  Non-toxic appearance. He does not appear ill. No distress.  HENT:  Head: Normocephalic and atraumatic.  Right Ear: Tympanic membrane, external ear and ear canal normal.  Left Ear: Tympanic membrane, external ear and ear canal normal.  Nose: Nose normal. No mucosal edema or rhinorrhea. Right sinus exhibits no maxillary sinus tenderness and no frontal sinus tenderness. Left sinus exhibits no maxillary sinus tenderness and no frontal sinus tenderness.  Mouth/Throat: Uvula is midline and oropharynx is clear and moist. No trismus in the jaw. No uvula swelling. No oropharyngeal exudate, posterior oropharyngeal edema or posterior oropharyngeal erythema.  Eyes: Pupils are equal, round, and reactive to light. Conjunctivae, EOM and lids are normal. No scleral icterus.  Neck: Trachea normal, normal range of motion and phonation normal. Neck supple. No tracheal deviation present. No thyromegaly present.  Cardiovascular: Normal rate, regular rhythm, normal heart sounds, intact distal pulses and normal pulses. Exam reveals no gallop and no friction rub.  No murmur heard. Pulses:      Radial pulses are 2+ on the right side, and 2+ on the left side.       Posterior tibial pulses are 2+ on the right side, and 2+ on the left side.  Pulmonary/Chest: Effort normal and breath sounds normal. No stridor. No respiratory distress. He has no wheezes. He has no rhonchi. He has no rales.  Abdominal: Soft. Normal appearance and bowel sounds are normal. He exhibits no distension. There is no tenderness. There is no rebound and no guarding.  Musculoskeletal: Normal range of motion. He exhibits no edema, tenderness or deformity.       Thoracic back: He exhibits no bony tenderness.       Lumbar back: He exhibits no bony tenderness.  Neurological:  He is alert and oriented to person, place, and time. He exhibits normal muscle  tone. Coordination and gait normal.  Skin: Skin is warm, dry and intact. Capillary refill takes less than 2 seconds. No rash noted. He is not diaphoretic.  Psychiatric: He has a normal mood and affect. His behavior is normal. Thought content normal. His affect is not angry and not inappropriate. His speech is not rapid and/or pressured and not slurred. Cognition and memory are normal. He does not exhibit a depressed mood. He expresses no homicidal and no suicidal ideation. He expresses no suicidal plans and no homicidal plans. He is communicative.  Nursing note and vitals reviewed.    Depression Screen PHQ 2/9 Scores 02/23/2018 01/29/2018 02/16/2017 08/14/2015  PHQ - 2 Score 3 0 0 0  PHQ- 9 Score 7 - - -     Office Visit from 02/23/2018 in [facility]  AUDIT-C Score  6        Assessment & Plan:     Routine Health Maintenance and Physical Exam  Exercise Activities and Dietary recommendations Goals    . Increase physical activity        There is no immunization history on file for this patient.  Health Maintenance  Topic Date Due  . HIV Screening  03/18/2000  . TETANUS/TDAP  04/05/2018 (Originally 03/18/2004)  . INFLUENZA VACCINE  03/11/2018   Order HIV lab work to be done when patient returns to his other lab work, he has not been compliant in the past, will see if we can obtain this. Refused Tdap  Discussed health benefits of physical activity, and encouraged him to engage in regular exercise appropriate for his age and condition.    -------------------------------------------------------------------- Problem List Items Addressed This Visit      Other   Chronic back pain    Pt mentions pain at end of CPE, asks for pain management, notes no pain improvement after 2015 surgery, did have improved LE sx Referral to pain mgmt      LFT elevation - Primary    May be secondary to ETOH?  Hx of ETOH abuse in chart hx Repeat CMP in 4 weeks, pt refuses to do now due  to continued binge drinking every weekend      Relevant Orders   COMPLETE METABOLIC PANEL WITH GFR   Elevated blood sugar    Follow up A1C Diet and exercise/lifestyle changes - printed information for pt      Relevant Orders   Hemoglobin A1c   COMPLETE METABOLIC PANEL WITH GFR   Sleeping excessive    Was unable to obtain hx of past stimulant use to tx.   Multiple failed attempts to obtain records from past PCP who passed away close practice Referred to neurology for sleep study and evaluation of hypersomnolence Patient returns today refuses all work-up and states that he threw Adderall when a trash can because it made him irritated.  Symptoms are improving since his wife and 2 children moved out of the house, believes he was avoiding them by sleeping, now he will wake himself up and he does not feel tired.  Refuses any medical treatment and refuses referrals at this time      Class 1 obesity with body mass index (BMI) of 30.0 to 30.9 in adult   Relevant Orders   COMPLETE METABOLIC PANEL WITH GFR   Lipid Panel   Depression   Alcohol consumption binge drinking   Relevant Orders   COMPLETE METABOLIC  PANEL WITH GFR    Other Visit Diagnoses    Screening for HIV (human immunodeficiency virus)       Relevant Orders   HIV antibody      I spent approximately 35 minutes with the patient today. Over 50% of this time was spent with counseling and educating the patient, addressing alcohol use, depressive sx, excessive sleep, depression resources.  Resource guide given, pt encouraged to seek therapy/eval, but he refuses.  Crisis hotline and walk-in clinic info given.    Pain management referral given, but pt advised to come back for back pain evaluation, he mentioned it at the end of visit, no acute changes, but discussed with pt that given his hx, we will need separate time to evaluate this and plan management. - chart will not allow me to enter orders at this time, will attempt again  later to enter orders   Danelle BerryLeisa Joseline Mccampbell, PA-C 02/24/18 3:18 PM  [facility]

## 2018-02-23 NOTE — Patient Instructions (Addendum)
RESOURCE GUIDE     Behavioral Health Resources in the Community  Walk in clinic 8:30-3:30 [place] in [place]   Intensive Outpatient Programs: [facility]      [address] [place], [place] [phone] Both a day and evening program       [facility] Outpatient     [address]        [place], [place] [address] [phone]         ADS: [facility] [address] [place] [phone]  [facility] Mental Health ACCESS LINE: [phone] or [phone] [address] [place], [place] [address] [email or web address]   Substance Abuse Resources: - [facility] Drug Services  [phone] - [facility] [phone] - [facility] [phone] Floydene Flock [phone] - Residential & Outpatient Substance Abuse Program  [phone]  Psychological Services: [facility]  [phone] [facility]  [phone] - [facility], [phone] [place]. [address] [place], ACCESS LINE: [phone] or [phone], [email or web address]  Mobile Crisis Teams:                                        [facility]         Mobile Crisis Care Unit [phone]             [facility] [address] Ginette Otto [phone]                                         Interventionist [address] [place] [phone]  Self-Help/Support Groups: Mental Health [facility]oc[facility] of [employer] of support groups [phone] (call for more info)  Narcotics Anonymous (NA) Caring [facility] [address] [place] - 2 meetings at this location  Residential Treatment Programs:  [facility]      [address]        [place]       [phone]         [facility] [address] [place] [address] [place], [place]   [address] [phone]  [facility]  [address] [place], [place] [address] [phone] Admissions: 8am-3pm M-F  [facility]     [address]        [place], [place] [address]       [phone]         [facility] [address] [place], [place] [phone]  [facility] [address] [place], [place] [phone]  [facility] - Intensive Outpatient      [address]     [place], [place]       [phone]         [facility] ([facility]     [address] [place], [place] [phone] or [phone]  Residential Treatment Services (RTS), Medicaid [address] [place], [place] [phone]  [facility]                                               [address] [place]  [phone]  [facility] Resources: [facility]- [phone]               General Therapy                                                Angie FavaJulie Brannon, PhD        [address]                                       [place], [place] [address]         [phone]   Insurance  [facility]   [address] [place], [place] [address] [phone]  [facility] [address] [place], [place] [address] [phone] Insurance/Medicaid/sponsorship through [facility]  [facility]                                              [address]                                        [place], [place] [address]    Therapy/tele-psych/case         [phone]          [facility] [address]   [place], [place]  [address]  Adolescent/group home/case management [phone]                                           Creola CornJulia Brannon PhD       General therapy       Insurance   [phone]         Dr. Lolly MustacheArfeen, Insurance, M-F [phone]- [phone]  [facility]  [employer] [facility] [address]                 [address]         [address] [place] [address]  [place]                                               [place]                              [place] Phone:  [phone]                                  Phone:  [phone]                   Phone:  [phone]  [facility], [phone] - [facility] Services - [phone]- [phone]       -     [facility] in [place],  [address]             [phone], Insurance     Health Maintenance, Male A healthy lifestyle and preventive care is important for your health and wellness. Ask your health care provider about what schedule of regular examinations is right for you. What should I know about weight and diet? Eat a Healthy Diet  Eat plenty of vegetables, fruits, whole grains, low-fat dairy products, and lean protein.  Do not eat a lot of foods high in solid fats, added sugars, or salt.  Maintain a Healthy Weight Regular exercise can help you achieve or maintain a healthy weight. You should:  Do at least 150 minutes of exercise each week. The exercise should increase your heart rate and make you sweat (moderate-intensity exercise).  Do strength-training exercises at least twice a week.  Watch Your Levels of Cholesterol and Blood Lipids  Have your blood tested for lipids and cholesterol every 5 years starting at 33 years of age. If you are at high risk for heart disease, you should start having your blood tested when you are 33 years old. You may need to have your cholesterol levels checked more often if: ? Your lipid or cholesterol levels are high. ? You are older than 33 years of age. ? You are at high risk for heart disease.  What should I know about cancer screening? Many types of cancers can be detected early and may often be prevented. Lung Cancer  You should be screened every year for lung cancer if: ? You are a current smoker who  has smoked for at least 30 years. ? You are a former smoker who has quit within the past 15 years.  Talk to your health care provider about your screening options, when you should start screening, and how often you should be screened.  Colorectal Cancer  Routine colorectal cancer screening usually begins at 33 years of age and should be repeated every 5-10 years until you are 33 years old. You may need to be screened more often if early forms of precancerous polyps or small growths are found. Your health care provider may recommend screening at an earlier age if you have risk factors for colon cancer.  Your health care provider may recommend using home test kits to check for hidden blood in the stool.  A small camera at the end of a tube can be used to examine your colon (sigmoidoscopy or colonoscopy). This checks for the earliest forms of colorectal cancer.  Prostate and Testicular Cancer  Depending on your age and overall health, your health care provider may do certain tests to screen for prostate and testicular cancer.  Talk to your health care provider about any symptoms or concerns you have about testicular or prostate cancer.  Skin Cancer  Check your skin from head to toe regularly.  Tell your health care provider about any new moles or changes in moles, especially if: ? There is a change in a mole's size, shape, or color. ? You have a mole that is larger than a pencil eraser.  Always use sunscreen. Apply sunscreen liberally and repeat throughout the day.  Protect yourself by wearing long sleeves, pants, a wide-brimmed hat, and sunglasses when outside.  What should I know about heart disease, diabetes, and high blood pressure?  If you are 65-88 years of age, have your blood pressure checked every 3-5 years. If you are 14 years of age or older, have your blood pressure  checked every year. You should have your blood pressure measured twice-once when you are at a hospital or  clinic, and once when you are not at a hospital or clinic. Record the average of the two measurements. To check your blood pressure when you are not at a hospital or clinic, you can use: ? An automated blood pressure machine at a pharmacy. ? A home blood pressure monitor.  Talk to your health care provider about your target blood pressure.  If you are between 5545-33 years old, ask your health care provider if you should take aspirin to prevent heart disease.  Have regular diabetes screenings by checking your fasting blood sugar level. ? If you are at a normal weight and have a low risk for diabetes, have this test once every three years after the age of 33. ? If you are overweight and have a high risk for diabetes, consider being tested at a younger age or more often.  A one-time screening for abdominal aortic aneurysm (AAA) by ultrasound is recommended for men aged 65-75 years who are current or former smokers. What should I know about preventing infection? Hepatitis B If you have a higher risk for hepatitis B, you should be screened for this virus. Talk with your health care provider to find out if you are at risk for hepatitis B infection. Hepatitis C Blood testing is recommended for:  Everyone born from [address]1946 through 1966.  Anyone with known risk factors for hepatitis C.  Sexually Transmitted Diseases (STDs)  You should be screened each year for STDs including gonorrhea and chlamydia if: ? You are sexually active and are younger than 33 years of age. ? You are older than 33 years of age and your health care provider tells you that you are at risk for this type of infection. ? Your sexual activity has changed since you were last screened and you are at an increased risk for chlamydia or gonorrhea. Ask your health care provider if you are at risk.  Talk with your health care provider about whether you are at high risk of being infected with HIV. Your health care provider may recommend a  prescription medicine to help prevent HIV infection.  What else can I do?  Schedule regular health, dental, and eye exams.  Stay current with your vaccines (immunizations).  Do not use any tobacco products, such as cigarettes, chewing tobacco, and e-cigarettes. If you need help quitting, ask your health care provider.  Limit alcohol intake to no more than 2 drinks per day. One drink equals 12 ounces of beer, 5 ounces of wine, or 1 ounces of hard liquor.  Do not use street drugs.  Do not share needles.  Ask your health care provider for help if you need support or information about quitting drugs.  Tell your health care provider if you often feel depressed.  Tell your health care provider if you have ever been abused or do not feel safe at home. This information is not intended to replace advice given to you by your health care provider. Make sure you discuss any questions you have with your health care provider. Document Released: 01/24/2008 Document Revised: 03/26/2016 Document Reviewed: 05/01/2015 Elsevier Interactive Patient Education  [employer]2018 Elsevier Inc.

## 2018-02-24 ENCOUNTER — Encounter: Payer: Self-pay | Admitting: Family Medicine

## 2018-02-24 DIAGNOSIS — R7989 Other specified abnormal findings of blood chemistry: Secondary | ICD-10-CM | POA: Insufficient documentation

## 2018-02-24 DIAGNOSIS — F419 Anxiety disorder, unspecified: Secondary | ICD-10-CM | POA: Insufficient documentation

## 2018-02-24 DIAGNOSIS — F329 Major depressive disorder, single episode, unspecified: Secondary | ICD-10-CM | POA: Insufficient documentation

## 2018-02-24 DIAGNOSIS — F1010 Alcohol abuse, uncomplicated: Secondary | ICD-10-CM | POA: Insufficient documentation

## 2018-02-24 DIAGNOSIS — R945 Abnormal results of liver function studies: Principal | ICD-10-CM

## 2018-02-24 DIAGNOSIS — E669 Obesity, unspecified: Secondary | ICD-10-CM | POA: Insufficient documentation

## 2018-02-24 DIAGNOSIS — G4710 Hypersomnia, unspecified: Secondary | ICD-10-CM | POA: Insufficient documentation

## 2018-02-24 DIAGNOSIS — Z6830 Body mass index (BMI) 30.0-30.9, adult: Secondary | ICD-10-CM | POA: Insufficient documentation

## 2018-02-24 DIAGNOSIS — F32A Depression, unspecified: Secondary | ICD-10-CM | POA: Insufficient documentation

## 2018-02-24 DIAGNOSIS — R739 Hyperglycemia, unspecified: Secondary | ICD-10-CM | POA: Insufficient documentation

## 2018-02-24 NOTE — Assessment & Plan Note (Signed)
Follow up A1C Diet and exercise/lifestyle changes - printed information for pt

## 2018-02-24 NOTE — Addendum Note (Signed)
Addended by: Danelle BerryAPIA, Shahram Alexopoulos on: 02/24/2018 03:45 PM   Modules accepted: Orders

## 2018-02-24 NOTE — Assessment & Plan Note (Signed)
May be secondary to ETOH?  Hx of ETOH abuse in chart hx Repeat CMP in 4 weeks, pt refuses to do now due to continued binge drinking every weekend

## 2018-02-24 NOTE — Assessment & Plan Note (Addendum)
Was unable to obtain hx of past stimulant use to tx.   Multiple failed attempts to obtain records from past PCP who passed away close practice Referred to neurology for sleep study and evaluation of hypersomnolence Patient returns today refuses all work-up and states that he threw Adderall when a trash can because it made him irritated.  Symptoms are improving since his wife and 2 children moved out of the house, believes he was avoiding them by sleeping, now he will wake himself up and he does not feel tired.  Refuses any medical treatment and refuses referrals at this time

## 2018-02-24 NOTE — Assessment & Plan Note (Signed)
Pt mentions pain at end of CPE, asks for pain management, notes no pain improvement after 2015 surgery, did have improved LE sx Referral to pain mgmt

## 2018-04-27 ENCOUNTER — Ambulatory Visit: Payer: BLUE CROSS/BLUE SHIELD | Admitting: Family Medicine

## 2018-05-04 ENCOUNTER — Encounter: Payer: Self-pay | Admitting: Family Medicine

## 2018-05-19 ENCOUNTER — Other Ambulatory Visit: Payer: Self-pay | Admitting: Nurse Practitioner

## 2018-05-19 DIAGNOSIS — M545 Low back pain, unspecified: Secondary | ICD-10-CM

## 2018-05-23 ENCOUNTER — Emergency Department (HOSPITAL_COMMUNITY)
Admission: EM | Admit: 2018-05-23 | Discharge: 2018-05-23 | Disposition: A | Discharge status: Home / Self Care | Payer: BLUE CROSS/BLUE SHIELD | Attending: Emergency Medicine | Admitting: Emergency Medicine

## 2018-05-23 ENCOUNTER — Encounter (HOSPITAL_COMMUNITY): Payer: Self-pay | Admitting: Emergency Medicine

## 2018-05-23 ENCOUNTER — Other Ambulatory Visit: Payer: Self-pay

## 2018-05-23 DIAGNOSIS — F1910 Other psychoactive substance abuse, uncomplicated: Secondary | ICD-10-CM | POA: Diagnosis not present

## 2018-05-23 DIAGNOSIS — Z79899 Other long term (current) drug therapy: Secondary | ICD-10-CM | POA: Insufficient documentation

## 2018-05-23 HISTORY — DX: Other psychoactive substance abuse, uncomplicated: F19.10

## 2018-05-23 LAB — RAPID URINE DRUG SCREEN, HOSP PERFORMED
Amphetamines: NOT DETECTED
Barbiturates: NOT DETECTED
Benzodiazepines: POSITIVE — AB
Cocaine: NOT DETECTED
Opiates: NOT DETECTED
Tetrahydrocannabinol: NOT DETECTED

## 2018-05-23 LAB — COMPREHENSIVE METABOLIC PANEL
ALBUMIN: 4.3 g/dL (ref 3.5–5.0)
ALK PHOS: 50 U/L (ref 38–126)
ALT: 28 U/L (ref 0–44)
ANION GAP: 8 (ref 5–15)
AST: 21 U/L (ref 15–41)
BILIRUBIN TOTAL: 0.8 mg/dL (ref 0.3–1.2)
BUN: 14 mg/dL (ref 6–20)
CALCIUM: 9.3 mg/dL (ref 8.9–10.3)
CO2: 28 mmol/L (ref 22–32)
CREATININE: 0.84 mg/dL (ref 0.61–1.24)
Chloride: 101 mmol/L (ref 98–111)
GFR calc Af Amer: >60 mL/min (ref >60)
GFR calc non Af Amer: >60 mL/min (ref >60)
GLUCOSE: 120 mg/dL — AB (ref 70–99)
Potassium: 3.5 mmol/L (ref 3.5–5.1)
Sodium: 137 mmol/L (ref 135–145)
TOTAL PROTEIN: 7.4 g/dL (ref 6.5–8.1)

## 2018-05-23 LAB — CBC WITH DIFFERENTIAL/PLATELET
Abs Immature Granulocytes: 0.02 10*3/uL (ref 0.00–0.07)
BASOS ABS: 0 10*3/uL (ref 0.0–0.1)
Basophils Relative: 0 %
EOS PCT: 3 %
Eosinophils Absolute: 0.2 10*3/uL (ref 0.0–0.5)
HCT: 40.9 % (ref 39.0–52.0)
Hemoglobin: 13.7 g/dL (ref 13.0–17.0)
Immature Granulocytes: 0 %
LYMPHS ABS: 1.8 10*3/uL (ref 0.7–4.0)
LYMPHS PCT: 23 %
MCH: 29.7 pg (ref 26.0–34.0)
MCHC: 33.5 g/dL (ref 30.0–36.0)
MCV: 88.5 fL (ref 80.0–100.0)
Monocytes Absolute: 0.8 10*3/uL (ref 0.1–1.0)
Monocytes Relative: 10 %
NRBC: 0 % (ref 0.0–0.2)
Neutro Abs: 5 10*3/uL (ref 1.7–7.7)
Neutrophils Relative %: 64 %
Platelets: 225 10*3/uL (ref 150–400)
RBC: 4.62 MIL/uL (ref 4.22–5.81)
RDW: 11.5 % (ref 11.5–15.5)
WBC: 7.7 10*3/uL (ref 4.0–10.5)

## 2018-05-23 LAB — TROPONIN I

## 2018-05-23 LAB — ETHANOL

## 2018-05-23 MED ORDER — ONDANSETRON HCL 4 MG/2ML IJ SOLN
4.0000 mg | Freq: Once | INTRAMUSCULAR | Status: AC
  Administered 2018-05-23: 4 mg via INTRAVENOUS
  Filled 2018-05-23: qty 2

## 2018-05-23 MED ORDER — ONDANSETRON 4 MG PO TBDP: 4.0000 mg | ORAL_TABLET | Freq: Three times a day (TID) | ORAL | 0 refills | Status: DC | PRN

## 2018-05-23 MED ORDER — ONDANSETRON 4 MG PO TBDP
4.0000 mg | ORAL_TABLET | Freq: Once | ORAL | Status: AC
  Administered 2018-05-23: 4 mg via ORAL
  Filled 2018-05-23: qty 1

## 2018-05-23 MED ORDER — CLONIDINE HCL 0.1 MG PO TABS: 0.2000 mg | ORAL_TABLET | Freq: Three times a day (TID) | ORAL | 0 refills | Status: DC

## 2018-05-23 MED ORDER — CLONIDINE HCL 0.1 MG PO TABS
0.1000 mg | ORAL_TABLET | Freq: Once | ORAL | Status: AC
  Administered 2018-05-23: 0.1 mg via ORAL
  Filled 2018-05-23: qty 1

## 2018-05-23 MED ORDER — HYDROXYZINE HCL 25 MG PO TABS
25.0000 mg | ORAL_TABLET | Freq: Once | ORAL | Status: AC
  Administered 2018-05-23: 25 mg via ORAL
  Filled 2018-05-23: qty 1

## 2018-05-23 MED ORDER — SODIUM CHLORIDE 0.9 % IV BOLUS (SEPSIS)
1000.0000 mL | Freq: Once | INTRAVENOUS | Status: AC
  Administered 2018-05-23: 1000 mL via INTRAVENOUS

## 2018-05-23 MED ORDER — SODIUM CHLORIDE 0.9 % IV SOLN
1000.0000 mL | INTRAVENOUS | Status: DC
  Administered 2018-05-23: 1000 mL via INTRAVENOUS

## 2018-05-23 NOTE — ED Notes (Signed)
Pt spouse reports her father was an addict and she vowed that her children would not live as she is  Yet now they are  Pt and spouse live in [place] and she is encouraged to seek counseling at [facility]

## 2018-05-23 NOTE — ED Notes (Signed)
Initial rounding and noted ST elevation Pt complains of headache, eye pain Dr C informed and trop and 2nd EKG

## 2018-05-23 NOTE — ED Notes (Signed)
Pt and spouse educated re: end stage addiction, WD possibilities, need to call 911 should CP, digff breathing, seizure etc, need to consider counseling or otpt tx, inpt tx as well as family counseling for spouse and/or children  meds reviewed as well as WD sx

## 2018-05-23 NOTE — ED Provider Notes (Addendum)
[facility] EMERGENCY DEPARTMENT Provider Note   CSN: [id number] Arrival date & time: 05/23/18  4098     History   Chief Complaint Chief Complaint  Patient presents with  . Medical Clearance    HPI Daniel Phelps is a 32 y.o. male.  The history is provided by the patient. No language interpreter was used.  Mental Health Problem  Presenting symptoms: agitation   Presenting symptoms comment:  Withdrawl Patient accompanied by:  Family member Onset quality:  Gradual Timing:  Constant Progression:  Worsening Context: drug abuse   Treatment compliance:  Untreated Relieved by:  Nothing Pt reports he is nauseated and sick.  Pt unable to keep fluids down.  Pt has been taking oxycodone and suboxone.  Pt reports he feels like he is in withdrawl  Past Medical History:  Diagnosis Date  . Anxiety   . Chronic back pain   . Depression   . GERD (gastroesophageal reflux disease)   . Substance abuse [facility])     Patient Active Problem List   Diagnosis Date Noted  . LFT elevation 02/24/2018  . Elevated blood sugar 02/24/2018  . Sleeping excessive 02/24/2018  . Class 1 obesity with body mass index (BMI) of 30.0 to 30.9 in adult 02/24/2018  . Depression 02/24/2018  . Alcohol consumption binge drinking 02/24/2018  . Chronic back pain 01/29/2018  . HNP (herniated nucleus pulposus), lumbar 07/04/2014    Past Surgical History:  Procedure Laterality Date  . HERNIA REPAIR    . LUMBAR LAMINECTOMY/DECOMPRESSION MICRODISCECTOMY N/A 07/04/2014   Procedure: LUMBAR LAMINECTOMY/DECOMPRESSION MICRODISCECTOMY 1 LEVEL,LEFT LUMBAR FIVE-SACRAL ONE;  Surgeon: Coletta Memos, MD;  Location: [facility] OR;  Service: Neurosurgery;  Laterality: N/A;  left  . SPINE SURGERY  07/04/2014   Left L5 semihemilaminectomy and L5/S1 discetomy         Home Medications    Prior to Admission medications   Medication Sig Start Date End Date Taking? Authorizing Provider  acetaminophen (TYLENOL) 500 MG tablet Take 1,000  mg by mouth every 6 (six) hours as needed.   Yes [provider]  baclofen (LIORESAL) 10 MG tablet Take 1 tablet by mouth 3 (three) times daily as needed. 04/07/18   [provider]  cloNIDine (CATAPRES) 0.1 MG tablet Take 2 tablets (0.2 mg total) by mouth 3 (three) times daily. 05/23/18   Elson Areas, PA-C  ondansetron (ZOFRAN ODT) 4 MG disintegrating tablet Take 1 tablet (4 mg total) by mouth every 8 (eight) hours as needed for nausea or vomiting. 05/23/18   Elson Areas, PA-C    Family History Family History  Problem Relation Age of Onset  . Other Mother   . Hernia Father     Social History Social History   Tobacco Use  . Smoking status: Never Smoker  . Smokeless tobacco: Never Used  Substance Use Topics  . Alcohol use: Yes    Alcohol/week: 6.0 standard drinks    Types: 6 Shots of liquor per week    Comment: last drink 2 days ago. usually drinks 3 days a week.  . Drug use: Yes    Types: Marijuana    Comment: last used yesterday     Allergies   Ibuprofen   Review of Systems Review of Systems  Neurological: Positive for light-headedness.  Psychiatric/Behavioral: Positive for agitation, decreased concentration and sleep disturbance.  All other systems reviewed and are negative.    Physical Exam Updated Vital Signs BP 108/71   Pulse (!) 55  Temp 98 F (36.7 C) (Oral)   Resp 14   Ht 6\' 2"  (1.88 m)   Wt 99.8 kg   SpO2 96%   BMI 28.25 kg/m   Physical Exam  Constitutional: He appears well-developed and well-nourished.  HENT:  Head: Normocephalic.  Right Ear: External ear normal.  Left Ear: External ear normal.  Eyes: Pupils are equal, round, and reactive to light.  Neck: Normal range of motion.  Cardiovascular: Normal rate, regular rhythm and normal heart sounds.  Pulmonary/Chest: Effort normal.  Abdominal: Soft.  Musculoskeletal: Normal range of motion.  Neurological: He is alert.  Skin: Skin is warm.  Psychiatric: He has a  normal mood and affect.  Nursing note and vitals reviewed.    ED Treatments / Results  Labs (all labs ordered are listed, but only abnormal results are displayed) Labs Reviewed  COMPREHENSIVE METABOLIC PANEL - Abnormal; Notable for the following components:      Result Value   Glucose, Bld 120 (*)    All other components within normal limits  RAPID URINE DRUG SCREEN, HOSP PERFORMED - Abnormal; Notable for the following components:   Benzodiazepines POSITIVE (*)    All other components within normal limits  CBC WITH DIFFERENTIAL/PLATELET  ETHANOL  TROPONIN I    EKG EKG Interpretation  Date/Time:  Sunday May 23 2018 08:57:55 EDT Ventricular Rate:  65 PR Interval:    QRS Duration: 92 QT Interval:  401 QTC Calculation: 417 R Axis:   81 Text Interpretation:  Sinus rhythm Inferior infarct, acute (LCx) Lateral leads are also involved >>> Acute MI <<< Confirmed by Cook, Brian ([id number]) on 05/23/2018 11:09:03 AM Also confirmed by Cook, Brian ([id number])  on 05/23/2018 1:15:54 PM   Radiology No results found.  Procedures Procedures (including critical care time)  Medications Ordered in ED Medications  sodium chloride 0.9 % bolus 1,000 mL (0 mLs Intravenous Stopped 05/23/18 1000)    Followed by  sodium chloride 0.9 % bolus 1,000 mL (0 mLs Intravenous Stopped 05/23/18 1125)    Followed by  0.9 %  sodium chloride infusion (0 mLs Intravenous Stopped 05/23/18 1312)  ondansetron (ZOFRAN) injection 4 mg (4 mg Intravenous Given 05/23/18 0923)  cloNIDine (CATAPRES) tablet 0.1 mg (0.1 mg Oral Given 05/23/18 0924)  hydrOXYzine (ATARAX/VISTARIL) tablet 25 mg (25 mg Oral Given 05/23/18 0924)  ondansetron (ZOFRAN-ODT) disintegrating tablet 4 mg (4 mg Oral Given 05/23/18 1125)     Initial Impression / Assessment and Plan / ED Course  I have reviewed the triage vital signs and the nursing notes.  Pertinent labs & imaging results that were available during my care of the patient were  reviewed by me and considered in my medical decision making (see chart for details).     MDM  Dr. Cook reviewed ekg and reviewed with cardiology.  No acute abnormality,  Pt given Iv fluids, clonidine, atarax and zofran.   Rn counseled on substance abuse and treatment.  Pt given Resource guides.   Final Clinical Impressions(s) / ED Diagnoses   Final diagnoses:  Polysubstance abuse (HCC)    ED Discharge Orders         Ordered    cloNIDine (CATAPRES) 0.1 MG tablet  3 times daily     05/23/18 1251    ondansetron (ZOFRAN ODT) 4 MG disintegrating tablet  Every 8 hours PRN,   Status:  Discontinued     10 /13/19 1251    ondansetron (ZOFRAN ODT) 4 MG disintegrating tablet  Every 8 hours PRN  05/23/18 1301        An After Visit Summary was printed and given to the patient.    Elson Areas, PA-C 05/23/18 1345    Osie Cheeks 05/27/18 1730    Donnetta Hutching, MD 05/28/18 [address] PA-C 05/31/18 0801    Donnetta Hutching, MD 06/05/18 [phone]

## 2018-05-23 NOTE — ED Triage Notes (Signed)
Pt reports is hooked on roxi and reports took 30 pills from off [address] this past week. Ran out Thursday. Took suboxone off street yesterday to help with withdrawal. Pt took xanax last night that was not his prescription. Pt reports today having trouble with vision, and feeling like his heart is off beat. Pt reports nausea. Pt reports he doesn't want help to get off drugs as he can do it himself.

## 2018-05-23 NOTE — ED Notes (Signed)
Awaiting re-eval and dispo 

## 2018-05-23 NOTE — ED Notes (Signed)
Called c-link at this time for consult

## 2018-05-23 NOTE — ED Notes (Signed)
Reality therapy: Daniel Phelps) Continued usage will result in death Studies suggest that self detox is unsuccessful There is no guarantee that street purchases are what dealers report that they are  Family resources/pt resources are available for both inpt and outpt detox

## 2018-06-05 ENCOUNTER — Ambulatory Visit
Admission: RE | Admit: 2018-06-05 | Discharge: 2018-06-05 | Disposition: A | Discharge status: Home / Self Care | Payer: BLUE CROSS/BLUE SHIELD | Source: Ambulatory Visit | Attending: Nurse Practitioner | Admitting: Nurse Practitioner

## 2018-06-05 DIAGNOSIS — M545 Low back pain, unspecified: Secondary | ICD-10-CM

## 2018-06-29 ENCOUNTER — Ambulatory Visit: Payer: BLUE CROSS/BLUE SHIELD | Admitting: Family Medicine

## 2018-12-26 IMAGING — MR MR LUMBAR SPINE W/O CM
4 of 5 series · 26 of 48 positions shown · non-contrast
Comparison: 06/20/2014 lumbar spine MRI

CLINICAL DATA: Chronic low back pain radiating to the left leg.

EXAM:
MRI LUMBAR SPINE WITHOUT CONTRAST
TECHNIQUE: Multiplanar, multisequence MR imaging of the lumbar spine was
performed. No intravenous contrast was administered.

[Series 4: T2 · sagittal · 4.0mm · 0.55mm/px · 6 of 13 slices shown (1 of 2)]
[im 1/13]
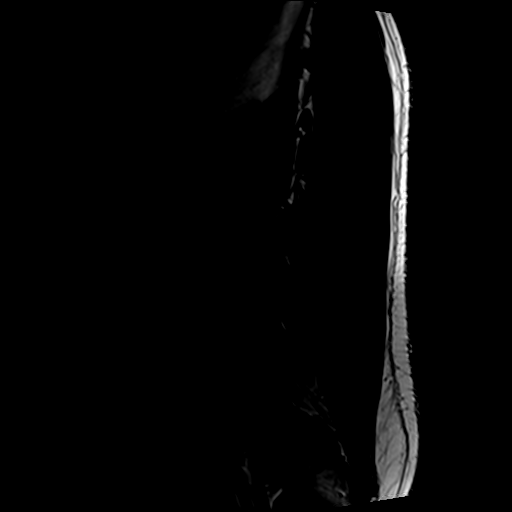
[im 3/13]
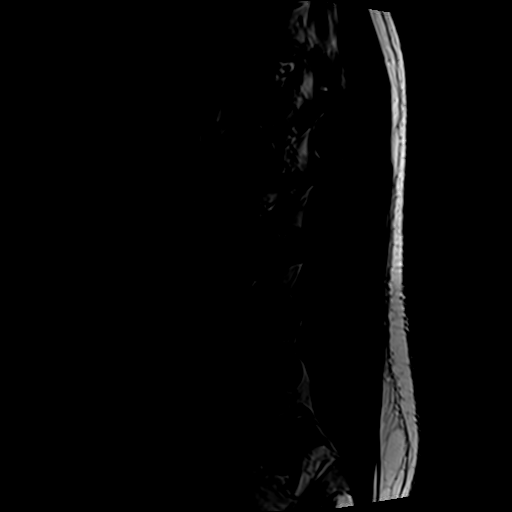
[im 5/13]
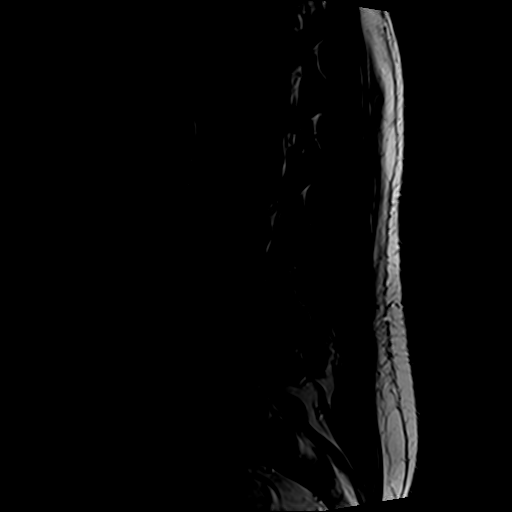
[im 8/13]
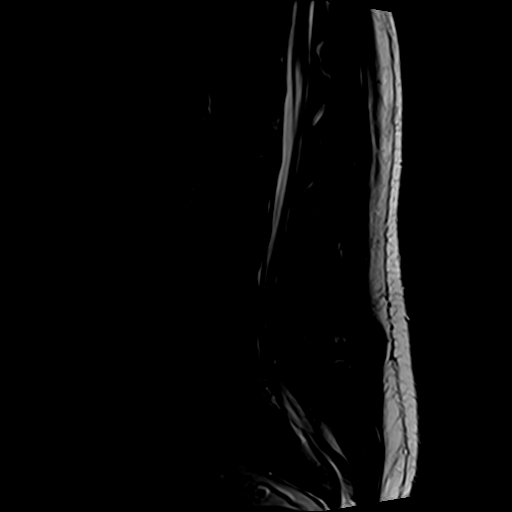
[im 10/13]
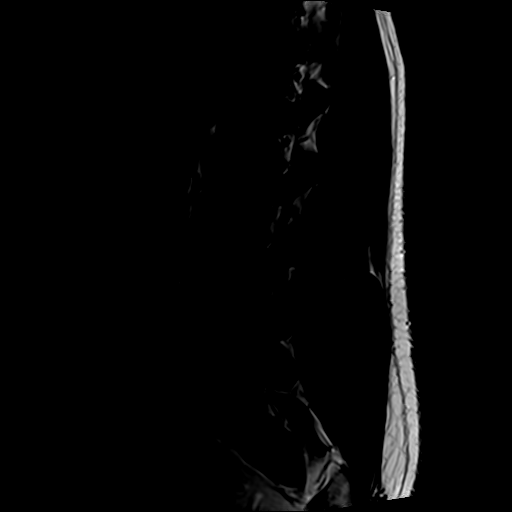
[im 13/13]
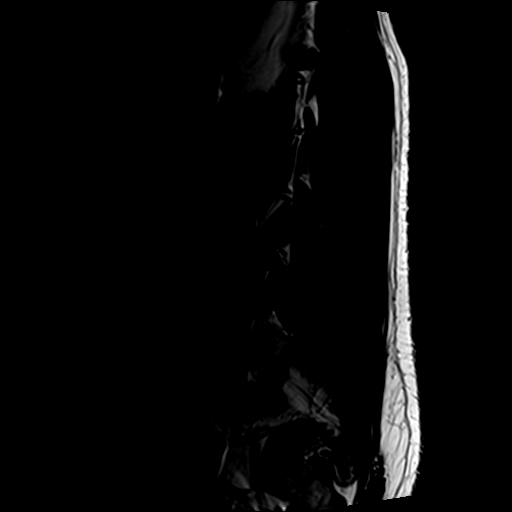

[Series 6: T1 · sagittal · 4.0mm · 0.55mm/px · 5 of 13 slices shown (1 of 2)]
[im 1/13]
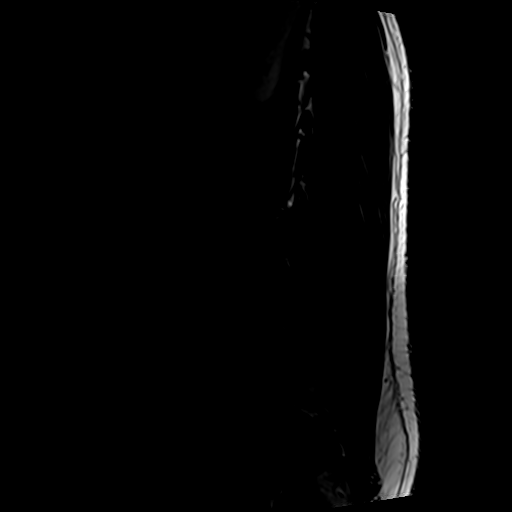
[im 4/13]
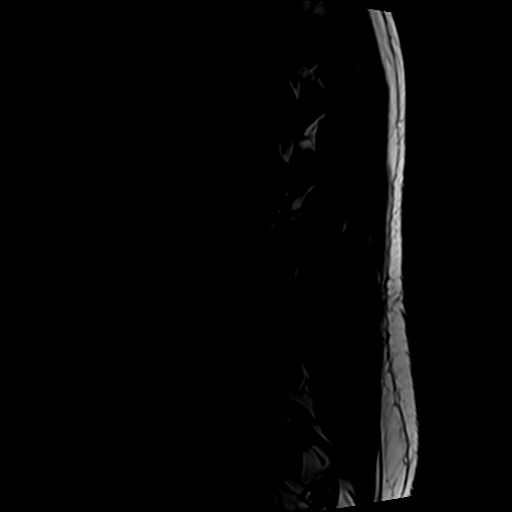
[im 7/13]
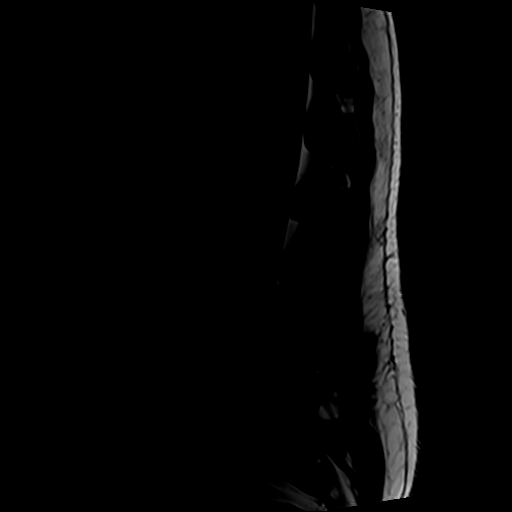
[im 10/13]
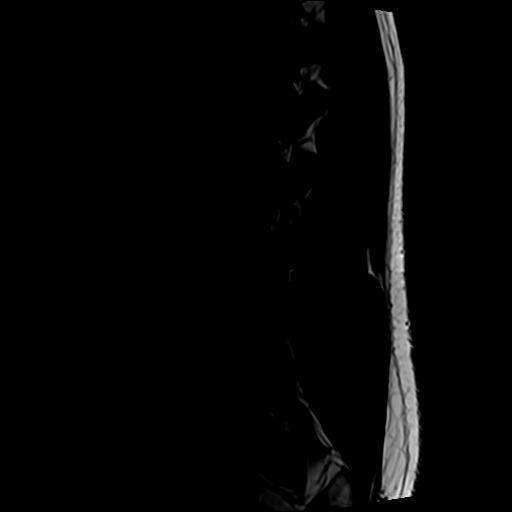
[im 13/13]
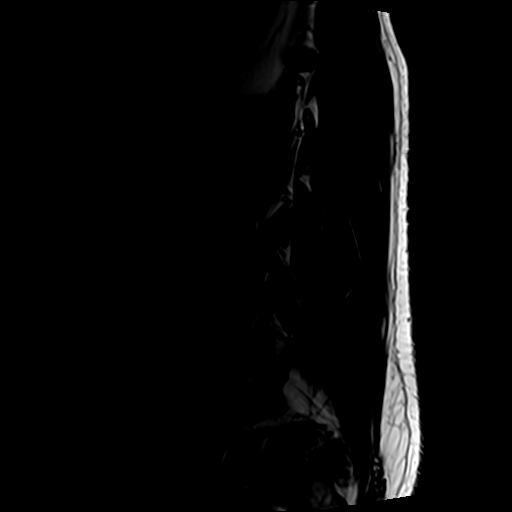

[Series 7: T2 · axial · 4.0mm · 0.70mm/px · z∈[-177,+54]mm · 10 of 42 slices shown (2 of 2)]
[im 3/42]
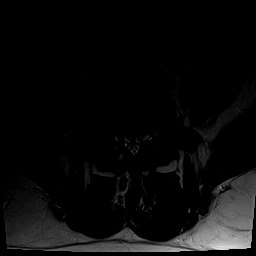
[im 6/42]
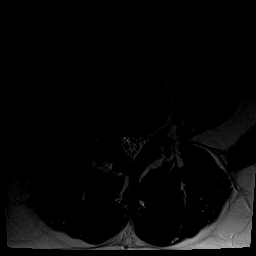
[im 9/42]
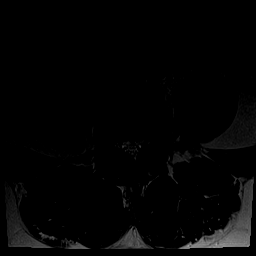
[im 14/42]
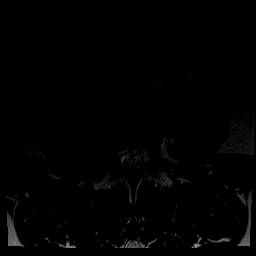
[im 20/42]
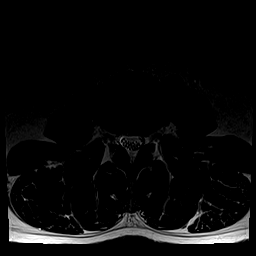
[im 22/42]
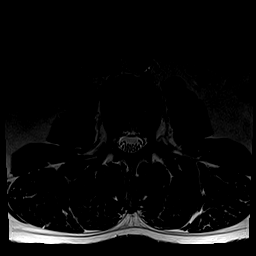
[im 25/42]
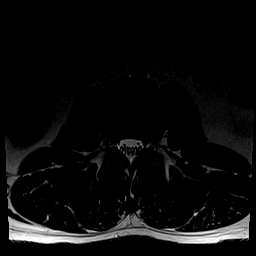
[im 31/42]
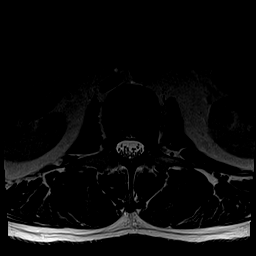
[im 36/42]
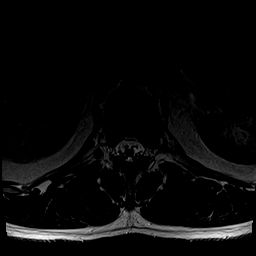
[im 42/42]
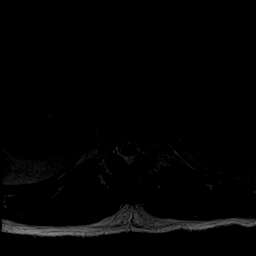

[Series 8: T1 · axial · 4.0mm · 0.35mm/px · z∈[-177,+23]mm · 5 of 42 slices shown (2 of 2)]
[im 3/42]
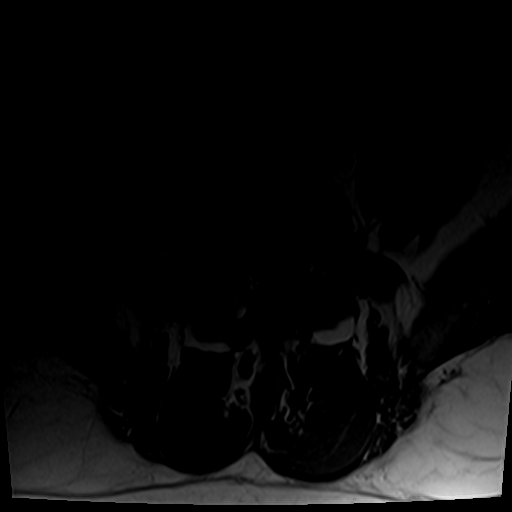
[im 6/42]
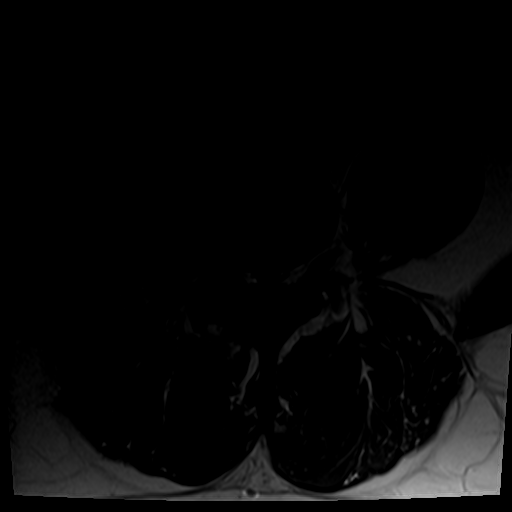
[im 9/42]
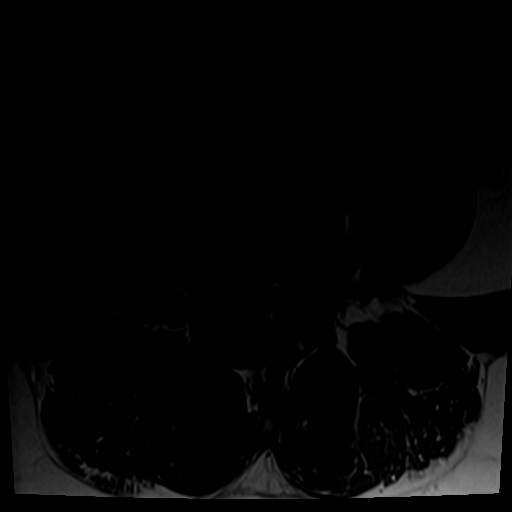
[im 22/42]
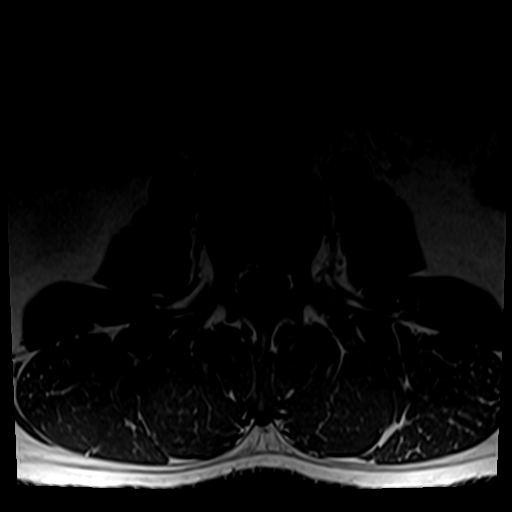
[im 36/42]
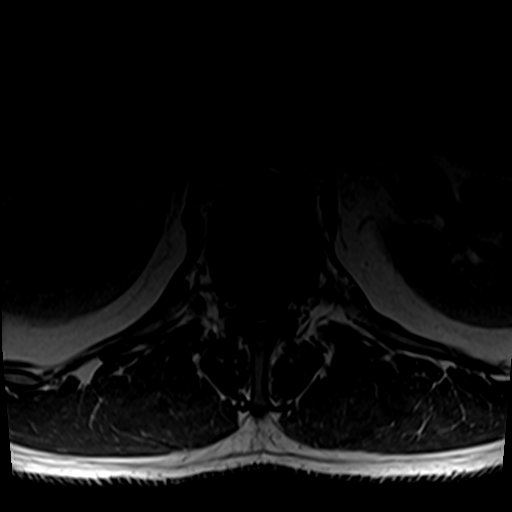

[26 of 48 positions shown; findings below may reference images not displayed]

FINDINGS: Segmentation: Normal. The lowest disc space is considered to be
L5-S1.

Alignment:  Normal

Vertebrae: No acute compression fracture, discitis-osteomyelitis of
focal marrow lesion.

Conus medullaris and cauda equina: The conus medullaris terminates
at the L1 level. The cauda equina and conus medullaris are both
normal.

Paraspinal and other soft tissues: The visualized aorta, IVC and
iliac vessels are normal. The visualized retroperitoneal organs and
paraspinal soft tissues are normal.

Disc levels: Sagittal plane imaging includes the T11-12 disc level
through the upper sacrum, with axial imaging of the T12-L1 to L5-S1
disc levels.

The T12-L4 levels are normal.

L4-L5: Minimal central disc protrusion with mild narrowing of the
spinal canal, likely partially due to congenital short pedicles.
These findings are unchanged.

L5-S1: Postsurgical changes of left hemilaminectomy for discectomy.
Left lateral recess stenosis has improved, but there is still
crowding of the descending left S1 nerve root. No neural foraminal
stenosis.

The visualized portion of the sacrum is normal.
IMPRESSION: 1. Postsurgical changes of partial discectomy at L5-S1 with improved
lateral recess stenosis, but persistent mass effect on the
descending left S1 nerve root.
2. Mild L4-5 spinal canal narrowing, unchanged, secondary to
congenital short pedicles in small central disc protrusion.

## 2020-11-17 ENCOUNTER — Other Ambulatory Visit: Payer: Self-pay

## 2020-11-17 ENCOUNTER — Emergency Department
Admission: EM | Admit: 2020-11-17 | Discharge: 2020-11-17 | Disposition: A | Discharge status: Home / Self Care | Payer: Self-pay | Attending: Emergency Medicine | Admitting: Emergency Medicine

## 2020-11-17 ENCOUNTER — Encounter: Payer: Self-pay | Admitting: Emergency Medicine

## 2020-11-17 DIAGNOSIS — R4689 Other symptoms and signs involving appearance and behavior: Secondary | ICD-10-CM

## 2020-11-17 DIAGNOSIS — F419 Anxiety disorder, unspecified: Secondary | ICD-10-CM | POA: Insufficient documentation

## 2020-11-17 DIAGNOSIS — Z711 Person with feared health complaint in whom no diagnosis is made: Secondary | ICD-10-CM

## 2020-11-17 LAB — CBC WITH DIFFERENTIAL/PLATELET
Abs Immature Granulocytes: 0.02 10*3/uL (ref 0.00–0.07)
Basophils Absolute: 0.1 10*3/uL (ref 0.0–0.1)
Basophils Relative: 1 %
Eosinophils Absolute: 0.1 10*3/uL (ref 0.0–0.5)
Eosinophils Relative: 1 %
HCT: 43.7 % (ref 39.0–52.0)
Hemoglobin: 14.8 g/dL (ref 13.0–17.0)
Immature Granulocytes: 0 %
Lymphocytes Relative: 19 %
Lymphs Abs: 1.8 10*3/uL (ref 0.7–4.0)
MCH: 30.3 pg (ref 26.0–34.0)
MCHC: 33.9 g/dL (ref 30.0–36.0)
MCV: 89.5 fL (ref 80.0–100.0)
Monocytes Absolute: 0.8 10*3/uL (ref 0.1–1.0)
Monocytes Relative: 9 %
Neutro Abs: 6.6 10*3/uL (ref 1.7–7.7)
Neutrophils Relative %: 70 %
Platelets: 236 10*3/uL (ref 150–400)
RBC: 4.88 MIL/uL (ref 4.22–5.81)
RDW: 11.9 % (ref 11.5–15.5)
WBC: 9.3 10*3/uL (ref 4.0–10.5)
nRBC: 0 % (ref 0.0–0.2)

## 2020-11-17 LAB — URINE DRUG SCREEN, QUALITATIVE (ARMC ONLY)
Amphetamines, Ur Screen: NOT DETECTED
Barbiturates, Ur Screen: NOT DETECTED
Benzodiazepine, Ur Scrn: NOT DETECTED
Cannabinoid 50 Ng, Ur ~~LOC~~: NOT DETECTED
Cocaine Metabolite,Ur ~~LOC~~: NOT DETECTED
MDMA (Ecstasy)Ur Screen: NOT DETECTED
Methadone Scn, Ur: NOT DETECTED
Opiate, Ur Screen: NOT DETECTED
Phencyclidine (PCP) Ur S: NOT DETECTED
Tricyclic, Ur Screen: NOT DETECTED

## 2020-11-17 LAB — URINALYSIS, COMPLETE (UACMP) WITH MICROSCOPIC
Bacteria, UA: NONE SEEN
Bilirubin Urine: NEGATIVE
Glucose, UA: NEGATIVE mg/dL
Ketones, ur: NEGATIVE mg/dL
Leukocytes,Ua: NEGATIVE
Nitrite: NEGATIVE
Protein, ur: NEGATIVE mg/dL
Specific Gravity, Urine: 1.004 — ABNORMAL LOW (ref 1.005–1.030)
Squamous Epithelial / LPF: NONE SEEN (ref 0–5)
pH: 8 (ref 5.0–8.0)

## 2020-11-17 LAB — COMPREHENSIVE METABOLIC PANEL
ALT: 72 U/L — ABNORMAL HIGH (ref 0–44)
AST: 49 U/L — ABNORMAL HIGH (ref 15–41)
Albumin: 4.3 g/dL (ref 3.5–5.0)
Alkaline Phosphatase: 57 U/L (ref 38–126)
Anion gap: 11 (ref 5–15)
BUN: 9 mg/dL (ref 6–20)
CO2: 24 mmol/L (ref 22–32)
Calcium: 9 mg/dL (ref 8.9–10.3)
Chloride: 102 mmol/L (ref 98–111)
Creatinine, Ser: 0.96 mg/dL (ref 0.61–1.24)
GFR, Estimated: >60 mL/min (ref >60)
Glucose, Bld: 118 mg/dL — ABNORMAL HIGH (ref 70–99)
Potassium: 3.3 mmol/L — ABNORMAL LOW (ref 3.5–5.1)
Sodium: 137 mmol/L (ref 135–145)
Total Bilirubin: 0.6 mg/dL (ref 0.3–1.2)
Total Protein: 7.5 g/dL (ref 6.5–8.1)

## 2020-11-17 LAB — ETHANOL: Alcohol, Ethyl (B): 52 mg/dL — ABNORMAL HIGH (ref <10)

## 2020-11-17 LAB — ACETAMINOPHEN LEVEL: Acetaminophen (Tylenol), Serum: 13 ug/mL (ref 10–30)

## 2020-11-17 LAB — SALICYLATE LEVEL: Salicylate Lvl: <7 mg/dL — ABNORMAL LOW (ref 7.0–30.0)

## 2020-11-17 MED ORDER — NAPROXEN 500 MG PO TABS
500.0000 mg | ORAL_TABLET | Freq: Once | ORAL | Status: AC
  Administered 2020-11-17: 500 mg via ORAL
  Filled 2020-11-17: qty 1

## 2020-11-17 MED ORDER — OLANZAPINE 5 MG PO TBDP
10.0000 mg | ORAL_TABLET | Freq: Once | ORAL | Status: AC
  Administered 2020-11-17: 10 mg via ORAL
  Filled 2020-11-17: qty 2

## 2020-11-17 NOTE — ED Notes (Signed)
Patient is resting comfortably. Call light in reach. Fall precautions in place.  

## 2020-11-17 NOTE — Discharge Instructions (Signed)
As we discussed, I would urge you to contact [facility] health services to use as a resource for any counseling or mental health needs.  If you develop any worsening symptoms, please return to the ED.

## 2020-11-17 NOTE — ED Provider Notes (Addendum)
[facility] Emergency Department Provider Note ____________________________________________   Event Date/Time   First MD Initiated Contact with Patient 11/17/20 1022     (approximate)  I have reviewed the triage vital signs and the nursing notes.  HISTORY  Chief Complaint Abdominal Pain   HPI Daniel Phelps is a 35 y.o. malewho presents to the ED for evaluation of chronic abdominal pain  Chart review indicates hx polysubstance abuse.   Patient presents to the ED for evaluation of 20 years of abdominal pain.  He reports right-sided abdominal pain to the triage nurse and left-sided abdominal pain to me.  With further questioning, patient repeatedly reports that he is in the business of saving people.  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  He reports he works by himself.  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   Patient reports he does not know when he last slept.  Reports he did not sleep at all last night.  He initially reports that he has died multiple times.  He then reports concern that he is going to die.  He denies any suicidality, suicidal intent, reporting "why would I hurt myself, I help save people."  Denies hallucinations.  Denies assault or that anyone has harmed him, or is out to get him.  When asked if we can draw blood to perform screening tests, he requests privacy and reports he does not want them to know, without elaboration.  He reports he lives at home by himself.  Tells me he has a girlfriend and a child who visit occasionally.  Reports drinking ethanol and smoking cannabis last night, denies additional recreational drugs.  Further history obtained later indicates a degree of chronic lower back pain for which he takes pain medicine at home.  He reports occasionally taking Tylenol, but not on a daily basis.  Denies taking too much.  Past Medical History:  Diagnosis Date  . Anxiety   . Chronic  back pain   . Depression   . GERD (gastroesophageal reflux disease)   . Substance abuse [facility])     Patient Active Problem List   Diagnosis Date Noted  . LFT elevation 02/24/2018  . Elevated blood sugar 02/24/2018  . Sleeping excessive 02/24/2018  . Class 1 obesity with body mass index (BMI) of 30.0 to 30.9 in adult 02/24/2018  . Depression 02/24/2018  . Alcohol consumption binge drinking 02/24/2018  . Chronic back pain 01/29/2018  . HNP (herniated nucleus pulposus), lumbar 07/04/2014    Past Surgical History:  Procedure Laterality Date  . HERNIA REPAIR    . LUMBAR LAMINECTOMY/DECOMPRESSION MICRODISCECTOMY N/A 07/04/2014   Procedure: LUMBAR LAMINECTOMY/DECOMPRESSION MICRODISCECTOMY 1 LEVEL,LEFT LUMBAR FIVE-SACRAL ONE;  Surgeon: Coletta Memos, MD;  Location: [facility] OR;  Service: Neurosurgery;  Laterality: N/A;  left  . SPINE SURGERY  07/04/2014   Left L5 semihemilaminectomy and L5/S1 discetomy     Prior to Admission medications   Medication Sig Start Date End Date Taking? Authorizing Provider  acetaminophen (TYLENOL) 500 MG tablet Take 1,000 mg by mouth every 6 (six) hours as needed.    [provider]  baclofen (LIORESAL) 10 MG tablet Take 1 tablet by mouth 3 (three) times daily as needed. 04/07/18   [provider]  cloNIDine (CATAPRES) 0.1 MG tablet Take 2 tablets (0.2 mg total) by mouth 3 (three) times daily. 05/23/18   Elson Areas, PA-C  ondansetron (ZOFRAN ODT) 4 MG disintegrating tablet  Take 1 tablet (4 mg total) by mouth every 8 (eight) hours as needed for nausea or vomiting. 05/23/18   Elson AreasSofia, Leslie K, PA-C    Allergies Ibuprofen  Family History  Problem Relation Age of Onset  . Other Mother   . Hernia Father     Social History Social History   Tobacco Use  . Smoking status: Never Smoker  . Smokeless tobacco: Never Used  Vaping Use  . Vaping Use: Never used  Substance Use Topics  . Alcohol use: Yes    Alcohol/week: 6.0 standard drinks     Types: 6 Shots of liquor per week    Comment: last drink 2 days ago. usually drinks 3 days a week.  . Drug use: Yes    Types: Marijuana    Comment: last used yesterday    Review of Systems  Constitutional: No fever/chills.  Positive generalized weakness and poor sleep Eyes: No visual changes. ENT: No sore throat. Cardiovascular: Denies chest pain. Respiratory: Denies shortness of breath. Gastrointestinal:No nausea, no vomiting.  No diarrhea.  No constipation. Positive for chronic pain Genitourinary: Negative for dysuria. Musculoskeletal: Negative for back pain. Skin: Negative for rash. Neurological: Negative for headaches, focal weakness or numbness.  ____________________________________________   PHYSICAL EXAM:  VITAL SIGNS: Vitals:   11/17/20 1100 11/17/20 1305  BP: (!) 156/100 (!) 149/99  Pulse: 82 70  Resp: 15 19  Temp:    SpO2: 97% 97%    Constitutional: Alert and oriented. Well appearing and in no acute distress.  Able to swing his legs over and stand without assistance, ambulatory with normal gait. Eyes: Conjunctivae are normal. PERRL. EOMI. Head: Atraumatic. Nose: No congestion/rhinnorhea. Mouth/Throat: Mucous membranes are moist.  Oropharynx non-erythematous. Neck: No stridor. No cervical spine tenderness to palpation. Cardiovascular: Normal rate, regular rhythm. Grossly normal heart sounds.  Good peripheral circulation. Respiratory: Normal respiratory effort.  No retractions. Lungs CTAB. Gastrointestinal: Soft , nondistended, nontender to palpation. No CVA tenderness.  Benign throughout. Musculoskeletal: No lower extremity tenderness nor edema.  No joint effusions. No signs of acute trauma. Neurologic:  Normal speech and language. No gross focal neurologic deficits are appreciated. No gait instability noted. Skin:  Skin is warm, dry and intact. No rash noted. Psychiatric: Mood and affect are flat with tangential thought  processes.. ____________________________________________   LABS (all labs ordered are listed, but only abnormal results are displayed)  Labs Reviewed  COMPREHENSIVE METABOLIC PANEL - Abnormal; Notable for the following components:      Result Value   Potassium 3.3 (*)    Glucose, Bld 118 (*)    AST 49 (*)    ALT 72 (*)    All other components within normal limits  ETHANOL - Abnormal; Notable for the following components:   Alcohol, Ethyl (B) 52 (*)    All other components within normal limits  SALICYLATE LEVEL - Abnormal; Notable for the following components:   Salicylate Lvl <7.0 (*)    All other components within normal limits  URINALYSIS, COMPLETE (UACMP) WITH MICROSCOPIC - Abnormal; Notable for the following components:   Color, Urine STRAW (*)    APPearance CLEAR (*)    Specific Gravity, Urine 1.004 (*)    Hgb urine dipstick SMALL (*)    All other components within normal limits  URINE DRUG SCREEN, QUALITATIVE ([facility] ONLY)  CBC WITH DIFFERENTIAL/PLATELET  ACETAMINOPHEN LEVEL   ____________________________________________  12 Lead EKG  Sinus rhythm, rate of 74 bpm.  Normal axis and intervals.  No evidence of  acute ischemia.  ____________________________________________   PROCEDURES and INTERVENTIONS  Procedure(s) performed (including Critical Care):  .1-3 Lead EKG Interpretation Performed by: Delton Prairie, MD Authorized by: Delton Prairie, MD     Interpretation: normal     ECG rate:  70   ECG rate assessment: normal     Rhythm: sinus rhythm     Ectopy: none     Conduction: normal      Medications  OLANZapine zydis (ZYPREXA) disintegrating tablet 10 mg (10 mg Oral Given 11/17/20 1108)  naproxen (NAPROSYN) tablet 500 mg (500 mg Oral Given 11/17/20 1224)    ____________________________________________   MDM / ED COURSE   35 year old male with history of polysubstance abuse presents to the ED voluntarily with various complaints, but with erratic and manic  behavior most concerning for psychiatric pathology and requiring psychiatric evaluation.  Normal vitals on room air.  Exam demonstrates no evidence of acute medical pathology.  He is in no distress, has no neurologic or vascular deficits, has a benign abdomen throughout and has no signs of trauma.  He has tangential thought processes and is occasionally difficult to follow, some concern from triage nursing that he does have auditory hallucinations.  I note some flight of ideas, thoughts of grandiosity about himself, no sleep in the recent past are concerning for mania.  He has no suicidality and I currently see no indication to IVC the patient.  He does have a not negative Tylenol level and marginal elevation of his LFTs that I do not believe represent acetaminophen toxicity.  He has no abdominal pain, tenderness or emesis.  LFTs may be due to his drinking.  Patient is tolerating p.o. intake without difficulty.  Nonfocal examination without signs of trauma to necessitate CT imaging of his brain at this time.  I see no evidence of medical pathology to cause his symptoms and I am again most concerned about psychiatric pathology.  We will have TTS and telepsychiatry evaluate the patient, he remains voluntary.  After TTS evaluation and prior to telepsychiatry evaluation, patient requests discharge.  I see no barriers to outpatient management.  He looks improved and has tolerated p.o. intake of a full meal tray without postprandial pain or nausea.  He reports feeling better and was just worried about dying today.  I provide him with follow-up resources for [facility] health services and we discussed return precautions for the ED.  Patient stable for outpatient management  Clinical Course as of 11/17/20 1441  Sat Nov 17, 2020  1206 Reassessed and asked patient about his Tylenol usage.  He reports using it occasionally.  Not every day.  Denies taking too much.  Denies taking it regularly over the past few days.   Reexamination of the abdomen reveals benign exam without apparent organomegaly [DS]  1207 He is reporting a headache and global myalgias.  We discussed naproxen and he is agreeable [DS]  1208 Doubt chronic acetaminophen toxicity.  We will have psychiatry evaluate the patient as I am primarily concerned about psychiatric pathology such as mania [DS]  1248 TTS seeing the patient [DS]  1327 I discussed the case with TTS, who is uncertain.  We will have a telepsych consultation [DS]  1434 Patient ambulates out of his room to the nursing desk and requests discharge. [DS]  1437 Echo reassessed the patient and he reports feeling better.  He reports that he is ready to go home.  Continues to deny suicidality, hallucinations.  He reports that he was worried that he  was going to die today, but he fails to elaborate upon why.  We discussed [facility] services as a resource and we discussed return precautions for the ED.  He expresses understanding and agreement. [DS]    Clinical Course User Index [DS] Delton Prairie, MD    ____________________________________________   FINAL CLINICAL IMPRESSION(S) / ED DIAGNOSES  Final diagnoses:  Abnormal behavior  Concern about death     ED Discharge Orders    None       Shafer Swamy Katrinka Blazing   Note:  This document was prepared using Dragon voice recognition software and may include unintentional dictation errors.   Delton Prairie, MD 11/17/20 1352    Delton Prairie, MD 11/17/20 [phone]

## 2020-11-17 NOTE — ED Triage Notes (Addendum)
Pt via POV from home. Pt while at registration desk pt states that he is here to be seen for RUQ abdominal pain for 20 years. When asked again for triage pt states he is here for pain all over. Pt states he has been weaker than normal for the past 10 days. Pt is A&Ox4 and NAD. During triage, pt asked "you not hearing people talking do you." Explained to pt that there was nobody talking. Asking if he was hearing voices. Pt states no. When asked why pt wanted to be seen today, pt states "I died three times" Admits to alcohol and marijuana use last night.

## 2020-11-17 NOTE — ED Notes (Signed)
Patient is resting comfortably. Call light in reach. Fall precautions in place. Pt given cup of ice water.

## 2020-11-17 NOTE — ED Notes (Signed)
Pt given a cup for urine sample to use the restroom. Explained we needed a urine sample. Pt asked if he could have some privacy while he used it. This RN stepped when returned it was revealed that pt did not urinate in the cup.

## 2020-11-17 NOTE — BH Assessment (Signed)
Comprehensive Clinical Assessment (CCA) Screening, Triage and Referral Note  11/17/2020 Daniel Phelps [id number]  Chief Complaint:  Chief Complaint  Patient presents with  . Abdominal Pain   Daniel Phelps arrived to ED by way of transportation by [facility].  He reports that "I feel like I might have been close to death a few times.  He reports pain in his abdomen and head pains.  He reports that he has been stressed out by "all kinds of stuff". He reports that he is always alone. He shared that he has anxiety attacks and believes that it is from being alone.  This is reported as happening for "a couple of years".  He denied symptoms of depression. He denied suicidal or homicidal ideation or intent.  He denied that he has visual or auditory hallucinations. He stated "I stare into the sun and it don't bother me".  He reports that he has been worrying a lot more about himself. He reports that he has used Whiskey "to cure the pain".  He states "Stress is hard to handle, time is hard to handle when you ain't got nobody".  Daniel Phelps appeared unclear at periods.  He appeared to steer off the thread of the interview, and associated looseness.  Daniel Phelps often returned to reporting feelings of loneliness.  He further reports that today is he ex-wife's birthday and he does not want to be reminded of "things".   Visit Diagnosis: Anxiety  Patient Reported Information How did you hear about [place]? Self   Referral name: Faythe Casa   Referral phone number: No data recorded Whom do you see for routine medical problems? -- (None)   Practice/Facility Name: No data recorded  Practice/Facility Phone Number: No data recorded  Name of Contact: No data recorded  Contact Number: No data recorded  Contact Fax Number: No data recorded  Prescriber Name: No data recorded  Prescriber Address (if known): No data recorded What Is the Reason for Your Visit/Call Today? "I felt like I was gonna die"  How Long Has This Been Causing You  Problems? 1 wk - 1 month (about 10 days)  Have You Recently Been in Any Inpatient Treatment (Hospital/Detox/Crisis Center/28-Day Program)? No   Name/Location of Program/Hospital:No data recorded  How Long Were You There? No data recorded  When Were You Discharged? No data recorded Have You Ever Received Services From [facility] Before? Yes   Who Do You See at [facility]? Back Surgery  Have You Recently Had Any Thoughts About Hurting Yourself? No   Are You Planning to Commit Suicide/Harm Yourself At This time?  No  Have you Recently Had Thoughts About Hurting Someone Daniel Phelps? No   Explanation: No data recorded Have You Used Any Alcohol or Drugs in the Past 24 Hours? Yes   How Long Ago Did You Use Drugs or Alcohol?  No data recorded  What Did You Use and How Much? Pint of whiskey  What Do You Feel Would Help You the Most Today? Social Support; Treatment for Depression or other mood problem (Something to help with anxiety)  Do You Currently Have a Therapist/Psychiatrist? No   Name of Therapist/Psychiatrist: No data recorded  Have You Been Recently Discharged From Any Office Practice or Programs? No   Explanation of Discharge From Practice/Program:  No data recorded    CCA Screening Triage Referral Assessment Type of Contact: Face-to-Face   Is this Initial or Reassessment? No data recorded  Date Telepsych consult ordered in [facility]:  No data recorded  Time Telepsych consult ordered in [facility]:  No data recorded Patient Reported Information Reviewed? Yes   Patient Left Without Being Seen? No data recorded  Reason for Not Completing Assessment: No data recorded Collateral Involvement: None  Does Patient Have a Court Appointed Legal Guardian? No data recorded  Name and Contact of Legal Guardian:  No data recorded If Minor and Not Living with Parent(s), Who has Custody? No data recorded Is CPS involved or ever been involved? Never  Is APS involved or ever been involved?  Never  Patient Determined To Be At Risk for Harm To Self or Others Based on Review of Patient Reported Information or Presenting Complaint? No   Method: No data recorded  Availability of Means: No data recorded  Intent: No data recorded  Notification Required: No data recorded  Additional Information for Danger to Others Potential:  No data recorded  Additional Comments for Danger to Others Potential:  No data recorded  Are There Guns or Other Weapons in Your Home?  No data recorded   Types of Guns/Weapons: No data recorded   Are These Weapons Safely Secured?                              No data recorded   Who Could Verify You Are Able To Have These Secured:    No data recorded Do You Have any Outstanding Charges, Pending Court Dates, Parole/Probation? No data recorded Contacted To Inform of Risk of Harm To Self or Others: No data recorded Location of Assessment: [facility] ED  Does Patient Present under Involuntary Commitment? No   IVC Papers Initial File Date: No data recorded  [place] of Residence: [place]  Patient Currently Receiving the Following Services: No data recorded  Determination of Need: No data recorded  Options For Referral: No data recorded  Justice Deeds, Counselor

## 2020-11-21 ENCOUNTER — Ambulatory Visit: Payer: Self-pay | Admitting: Nurse Practitioner

## 2020-11-22 ENCOUNTER — Ambulatory Visit (INDEPENDENT_AMBULATORY_CARE_PROVIDER_SITE_OTHER): Payer: Self-pay | Admitting: Nurse Practitioner

## 2020-11-22 ENCOUNTER — Other Ambulatory Visit: Payer: Self-pay

## 2020-11-22 ENCOUNTER — Encounter: Payer: Self-pay | Admitting: Nurse Practitioner

## 2020-11-22 VITALS — BP 138/88 | HR 96 | Temp 97.2°F | Ht 74.0 in | Wt 233.4 lb

## 2020-11-22 DIAGNOSIS — F419 Anxiety disorder, unspecified: Secondary | ICD-10-CM

## 2020-11-22 DIAGNOSIS — F32A Depression, unspecified: Secondary | ICD-10-CM

## 2020-11-22 DIAGNOSIS — R748 Abnormal levels of other serum enzymes: Secondary | ICD-10-CM | POA: Insufficient documentation

## 2020-11-22 DIAGNOSIS — R440 Auditory hallucinations: Secondary | ICD-10-CM

## 2020-11-22 MED ORDER — BUSPIRONE HCL 5 MG PO TABS: 5.0000 mg | ORAL_TABLET | Freq: Two times a day (BID) | ORAL | 1 refills | Status: DC | PRN

## 2020-11-22 NOTE — Progress Notes (Signed)
Subjective:    Patient ID: Daniel Phelps, male    DOB: 22-Jan-1986, 35 y.o.   MRN: [id number]  HPI: Daniel Phelps is a 35 y.o. male presenting for anxiety and questions about hepatitis C.  Chief Complaint  Patient presents with  . screening    Wants screening for hep c, last liver test was abnormal  . Anxiety    Gad 7 completed   ANXIETY/STRESS Reports that lately, his anxiety has been worsening.  He is not able to see his children right now and that causes him anxiety.  It also sounds like he does not like being at home alone.  He tells me near the end of the visit that he hears people he knows "talking in the air" and telling him to move out of the house that he lives in. Duration: Chronic Anxious mood: yes  Excessive worrying: yes Irritability: yes  Sweating: no Nausea: no Palpitations:no Hyperventilation: no Panic attacks: no Agoraphobia: no  Obscessions/compulsions: no Depressed mood: yes Depression screen [facility] 2/9 11/22/2020 02/23/2018 01/29/2018 02/16/2017 08/14/2015  Decreased Interest 2 2 0 0 0  Down, Depressed, Hopeless 3 1 0 0 0  PHQ - 2 Score 5 3 0 0 0  Altered sleeping 3 1 - - -  Tired, decreased energy 1 1 - - -  Change in appetite 0 0 - - -  Feeling bad or failure about yourself  3 1 - - -  Trouble concentrating 0 1 - - -  Moving slowly or fidgety/restless 0 0 - - -  Suicidal thoughts 0 0 - - -  PHQ-9 Score 12 7 - - -  Difficult doing work/chores Not difficult at all Not difficult at all - - -    GAD 7 : Generalized Anxiety Score 11/22/2020  Nervous, Anxious, on Edge 3  Control/stop worrying 3  Worry too much - different things 2  Trouble relaxing 2  Restless 0  Easily annoyed or irritable 0  Afraid - awful might happen 0  Total GAD 7 Score 10  Anxiety Difficulty Somewhat difficult    Anhedonia: no Weight changes: no Insomnia: yes   Hypersomnia: no Fatigue/loss of energy: yes Feelings of worthlessness: yes Feelings of guilt: yes Impaired  concentration/indecisiveness: no Suicidal ideations: no  Crying spells: no Recent Stressors/Life Changes: no   Relationship problems: yes   Family stress: no     Financial stress: yes    Job stress: no    Recent death/loss: no  He also reports he would like to be treated for Hepatitis C if he has it.  He is concerned that his elevated liver enzymes means he has hepatitis C.  He is also worried about his elevated glucose and is wondering if he could have diabetes.  He denies ever using IV drugs and does not have a family history of diabetes.  He denies polyuria, polydipsia, and polyphagia.  Allergies  Allergen Reactions  . Ibuprofen Other (See Comments)    Sends pain down spine    Outpatient Encounter Medications as of 11/22/2020  Medication Sig Note  . busPIRone (BUSPAR) 5 MG tablet Take 1 tablet (5 mg total) by mouth 2 (two) times daily as needed (anxiety). Take first dose at nighttime and monitor for drowsiness.  If this medication makes you drowsy, do not take while driving or operating heavy machinery.   . [DISCONTINUED] acetaminophen (TYLENOL) 500 MG tablet Take 1,000 mg by mouth every 6 (six) hours as needed.   . [  DISCONTINUED] baclofen (LIORESAL) 10 MG tablet Take 1 tablet by mouth 3 (three) times daily as needed. 05/23/2018: Patient has a current prescription but does not wish to take  . [DISCONTINUED] cloNIDine (CATAPRES) 0.1 MG tablet Take 2 tablets (0.2 mg total) by mouth 3 (three) times daily.   . [DISCONTINUED] ondansetron (ZOFRAN ODT) 4 MG disintegrating tablet Take 1 tablet (4 mg total) by mouth every 8 (eight) hours as needed for nausea or vomiting.    No facility-administered encounter medications on file as of 11/22/2020.    Patient Active Problem List   Diagnosis Date Noted  . Elevated liver enzymes 11/22/2020  . LFT elevation 02/24/2018  . Elevated blood sugar 02/24/2018  . Sleeping excessive 02/24/2018  . Class 1 obesity with body mass index (BMI) of 30.0 to 30.9  in adult 02/24/2018  . Anxiety and depression 02/24/2018  . Alcohol consumption binge drinking 02/24/2018  . Chronic back pain 01/29/2018  . HNP (herniated nucleus pulposus), lumbar 07/04/2014    Past Medical History:  Diagnosis Date  . Anxiety   . Chronic back pain   . Depression   . GERD (gastroesophageal reflux disease)   . Substance abuse (HCC)     Relevant past medical, surgical, family and social history reviewed and updated as indicated. Interim medical history since our last visit reviewed.  Review of Systems Per HPI unless specifically indicated above     Objective:    BP 138/88   Pulse 96   Temp (!) 97.2 F (36.2 C)   Ht 6\' 2"  (1.88 m)   Wt 233 lb 6.4 oz (105.9 kg)   SpO2 96%   BMI 29.97 kg/m   Wt Readings from Last 3 Encounters:  11/22/20 233 lb 6.4 oz (105.9 kg)  11/17/20 230 lb (104.3 kg)  05/23/18 220 lb (99.8 kg)    Physical Exam Vitals and nursing note reviewed.  Constitutional:      General: He is not in acute distress.    Appearance: Normal appearance. He is normal weight. He is not toxic-appearing.  HENT:     Head: Normocephalic and atraumatic.  Eyes:     General: No scleral icterus.    Extraocular Movements: Extraocular movements intact.     Pupils: Pupils are equal, round, and reactive to light.  Skin:    General: Skin is warm and dry.     Capillary Refill: Capillary refill takes less than 2 seconds.     Coloration: Skin is not jaundiced or pale.     Findings: No erythema.  Neurological:     Mental Status: He is alert and oriented to person, place, and time.     Motor: No weakness.     Gait: Gait normal.  Psychiatric:        Attention and Perception: Attention normal. He is attentive. He perceives auditory hallucinations. He does not perceive visual hallucinations.        Mood and Affect: Mood is anxious. Mood is not depressed or elated. Affect is not labile, blunt, flat, angry, tearful or inappropriate.        Speech: He is  communicative. Speech is rapid and pressured and tangential. Speech is not delayed or slurred.        Behavior: Behavior normal. Behavior is not agitated, aggressive, withdrawn, hyperactive or combative. Behavior is cooperative.        Thought Content: Thought content does not include homicidal or suicidal ideation. Thought content does not include homicidal or suicidal plan.  Cognition and Memory: Cognition and memory normal.        Judgment: Judgment normal.       Assessment & Plan:   Problem List Items Addressed This Visit      Other   Elevated liver enzymes - Primary    Acute on chronic.  Given significant alcohol intake, I think it is likely that his elevated liver enzymes are related to the alcohol intake.  He denies ever using IV drugs or sharing needles, so he is low risk for hepatitis C.  I explained this to the patient at length.  I did discuss the guidelines with him and that it is recommended to screen for hepatitis C once per lifetime.  He is agreeable to screening, but is worried about cost given he is self-pay.  Patient declines today, but will consider for future.      Anxiety and depression    Acute on chronic.  PHQ-9 and GAD-7 both elevated today.  Denies SI/HI today.  Discussed auditory hallucinations with the patient-this is likely related to a mood disorder such as schizophrenia.  He is agreeable to see psychiatry, however is worried about the cost.  I will place a referral to psychiatry today.  In the meantime, we will start on twice daily as needed BuSpar 5 mg.  Follow-up in 4 weeks.      Relevant Medications   busPIRone (BUSPAR) 5 MG tablet       Follow up plan: Return in about 4 weeks (around 12/20/2020) for mood f/u.

## 2020-11-22 NOTE — Assessment & Plan Note (Addendum)
Acute on chronic.  PHQ-9 and GAD-7 both elevated today.  Denies SI/HI today.  Discussed auditory hallucinations with the patient-this is likely related to a mood disorder such as schizophrenia.  He is agreeable to see psychiatry, however is worried about the cost.  I will place a referral to psychiatry today.  In the meantime, we will start on twice daily as needed BuSpar 5 mg.  Follow-up in 4 weeks.

## 2020-11-22 NOTE — Assessment & Plan Note (Signed)
Acute on chronic.  Given significant alcohol intake, I think it is likely that his elevated liver enzymes are related to the alcohol intake.  He denies ever using IV drugs or sharing needles, so he is low risk for hepatitis C.  I explained this to the patient at length.  I did discuss the guidelines with him and that it is recommended to screen for hepatitis C once per lifetime.  He is agreeable to screening, but is worried about cost given he is self-pay.  Patient declines today, but will consider for future.

## 2020-12-24 ENCOUNTER — Ambulatory Visit: Payer: Self-pay | Admitting: Nurse Practitioner

## 2021-05-14 ENCOUNTER — Telehealth: Payer: Self-pay

## 2022-01-10 ENCOUNTER — Ambulatory Visit (HOSPITAL_COMMUNITY)
Admission: RE | Admit: 2022-01-10 | Discharge: 2022-01-10 | Disposition: A | Discharge status: Home / Self Care | Payer: Self-pay | Source: Ambulatory Visit | Attending: Family Medicine | Admitting: Family Medicine

## 2022-01-10 ENCOUNTER — Encounter: Payer: Self-pay | Admitting: Family Medicine

## 2022-01-10 ENCOUNTER — Ambulatory Visit (INDEPENDENT_AMBULATORY_CARE_PROVIDER_SITE_OTHER): Payer: PRIVATE HEALTH INSURANCE | Admitting: Family Medicine

## 2022-01-10 VITALS — BP 132/84 | HR 65 | Temp 98.3°F | Ht 74.0 in | Wt 234.4 lb

## 2022-01-10 DIAGNOSIS — R739 Hyperglycemia, unspecified: Secondary | ICD-10-CM

## 2022-01-10 DIAGNOSIS — R7989 Other specified abnormal findings of blood chemistry: Secondary | ICD-10-CM | POA: Diagnosis not present

## 2022-01-10 DIAGNOSIS — R1013 Epigastric pain: Secondary | ICD-10-CM | POA: Diagnosis not present

## 2022-01-10 DIAGNOSIS — Z13220 Encounter for screening for lipoid disorders: Secondary | ICD-10-CM

## 2022-01-10 DIAGNOSIS — R0781 Pleurodynia: Secondary | ICD-10-CM

## 2022-01-10 NOTE — Patient Instructions (Signed)
Labs and Xray today.  We will set up an ultrasound after your labs return.  Follow up will be arranged after labs and xray return.  Take care  Dr. Adriana Simas

## 2022-01-11 LAB — CMP14+EGFR
ALT: 46 IU/L — ABNORMAL HIGH (ref 0–44)
AST: 32 IU/L (ref 0–40)
Albumin/Globulin Ratio: 1.6 (ref 1.2–2.2)
Albumin: 4.6 g/dL (ref 4.0–5.0)
Alkaline Phosphatase: 79 IU/L (ref 44–121)
BUN/Creatinine Ratio: 16 (ref 9–20)
BUN: 16 mg/dL (ref 6–20)
Bilirubin Total: 0.3 mg/dL (ref 0.0–1.2)
CO2: 23 mmol/L (ref 20–29)
Calcium: 9.8 mg/dL (ref 8.7–10.2)
Chloride: 104 mmol/L (ref 96–106)
Creatinine, Ser: 1.03 mg/dL (ref 0.76–1.27)
Globulin, Total: 2.8 g/dL (ref 1.5–4.5)
Glucose: 84 mg/dL (ref 70–99)
Potassium: 4.2 mmol/L (ref 3.5–5.2)
Sodium: 143 mmol/L (ref 134–144)
Total Protein: 7.4 g/dL (ref 6.0–8.5)
eGFR: 97 mL/min/{1.73_m2} (ref >59)

## 2022-01-11 LAB — CBC
Hematocrit: 45.5 % (ref 37.5–51.0)
Hemoglobin: 15.6 g/dL (ref 13.0–17.7)
MCH: 30 pg (ref 26.6–33.0)
MCHC: 34.3 g/dL (ref 31.5–35.7)
MCV: 88 fL (ref 79–97)
Platelets: 256 10*3/uL (ref 150–450)
RBC: 5.2 x10E6/uL (ref 4.14–5.80)
RDW: 12.3 % (ref 11.6–15.4)
WBC: 7.7 10*3/uL (ref 3.4–10.8)

## 2022-01-11 LAB — HEMOGLOBIN A1C
Est. average glucose Bld gHb Est-mCnc: 105 mg/dL
Hgb A1c MFr Bld: 5.3 % (ref 4.8–5.6)

## 2022-01-11 LAB — LIPID PANEL
Chol/HDL Ratio: 4.4 ratio (ref 0.0–5.0)
Cholesterol, Total: 149 mg/dL (ref 100–199)
HDL: 34 mg/dL — ABNORMAL LOW (ref >39)
LDL Chol Calc (NIH): 82 mg/dL (ref 0–99)
Triglycerides: 195 mg/dL — ABNORMAL HIGH (ref 0–149)
VLDL Cholesterol Cal: 33 mg/dL (ref 5–40)

## 2022-01-12 ENCOUNTER — Other Ambulatory Visit: Payer: Self-pay | Admitting: Family Medicine

## 2022-01-12 DIAGNOSIS — R1013 Epigastric pain: Secondary | ICD-10-CM | POA: Insufficient documentation

## 2022-01-12 DIAGNOSIS — R0781 Pleurodynia: Secondary | ICD-10-CM | POA: Insufficient documentation

## 2022-01-12 NOTE — Assessment & Plan Note (Signed)
Patient endorsing chronic lower rib pain which is worse with inhalation.  Labs and chest x-ray for further evaluation today.  He is overall well-appearing and is nontender on exam.

## 2022-01-12 NOTE — Progress Notes (Signed)
Subjective:  Patient ID: Daniel Phelps, male    DOB: [birth year]  Age: 36 y.o. MRN: [id number]  CC: Chief Complaint  Patient presents with   Establish Care    Pt having left side when breathing(sharp pain) under rib cage(stabbing); has radiated to right side;painful after eating along with abd pain after eating. Stools are dry. Chest tightness at times. Has noticed that sometime in urine; it has something in it. Pt had blood work done last April     HPI:  36 year old male presents as a new patient to establish care.  He has multiple complaints/concerns at this time.  Patient reports that he has had left lower rib pain for many years.  He states that he has had this pain at least since he was 36 years old.  He states that the pain is worse when he takes a deep breath.  He states that he is now having pain on the right lower ribs as well.  He states that he has had some epigastric discomfort and some central chest discomfort with eating.  He states that this has been going on for the past few months.  He states that he also has "dry stool".  He is concerned about his symptoms and states that he feels that he needs a thorough work-up.  He mentions ultrasound and colonoscopy.  Denies fever.  Denies nausea vomiting.  Does endorse fatigue.  No shortness of breath or cough.  Patient denies alcohol use.  Denies use.  He does have a history of alcohol use per the EMR.  Additionally, patient has had prior visits to the ER where he endorsed polysubstance abuse.  He is also had prior ER visits where he endorses what appears to be the same types of pain that he is describing today.  Patient Active Problem List   Diagnosis Date Noted   Pleuritic pain 01/12/2022   Epigastric pain 01/12/2022   Class 1 obesity with body mass index (BMI) of 30.0 to 30.9 in adult 02/24/2018   Anxiety and depression 02/24/2018   Alcohol consumption binge drinking 02/24/2018   Chronic back pain 01/29/2018    Social Hx   Social  History   Socioeconomic History   Marital status: Divorced    Spouse name: Not on file   Number of children: 2   Years of education: Not on file   Highest education level: Not on file  Occupational History   Not on file  Tobacco Use   Smoking status: Never   Smokeless tobacco: Never  Vaping Use   Vaping Use: Never used  Substance and Sexual Activity   Alcohol use: Yes    Alcohol/week: 6.0 standard drinks    Types: 6 Shots of liquor per week    Comment: last drink 2 days ago. usually drinks 3 days a week.   Drug use: Yes    Types: Marijuana    Comment: last used yesterday   Sexual activity: Not Currently  Other Topics Concern   Not on file  Social History Narrative   Not on file   Social Determinants of Health   Financial Resource Strain: Not on file  Food Insecurity: Not on file  Transportation Needs: Not on file  Physical Activity: Not on file  Stress: Not on file  Social Connections: Not on file    Review of Systems Per HPI  Objective:  BP 132/84   Pulse 65   Temp 98.3 F (36.8 C)   Ht $R'6\' 2"'Bt$  (  1.88 m)   Wt 234 lb 6.4 oz (106.3 kg)   BMI 30.10 kg/m      01/10/2022    2:15 PM 11/22/2020   10:58 AM 11/17/2020    1:05 PM  BP/Weight  Systolic BP 809 983 382  Diastolic BP 84 88 99  Wt. (Lbs) 234.4 233.4   BMI 30.1 kg/m2 29.97 kg/m2     Physical Exam Constitutional:      General: He is not in acute distress.    Appearance: Normal appearance.  HENT:     Head: Normocephalic and atraumatic.  Eyes:     General:        Right eye: No discharge.        Left eye: No discharge.     Conjunctiva/sclera: Conjunctivae normal.  Cardiovascular:     Rate and Rhythm: Normal rate and regular rhythm.  Pulmonary:     Effort: Pulmonary effort is normal.     Breath sounds: Normal breath sounds. No wheezing, rhonchi or rales.  Abdominal:     General: There is no distension.     Palpations: Abdomen is soft.     Tenderness: There is no abdominal tenderness.   Neurological:     Mental Status: He is alert.  Psychiatric:     Comments: Flat affect.    Lab Results  Component Value Date   WBC 7.7 01/10/2022   HGB 15.6 01/10/2022   HCT 45.5 01/10/2022   PLT 256 01/10/2022   GLUCOSE 84 01/10/2022   CHOL 149 01/10/2022   TRIG 195 (H) 01/10/2022   HDL 34 (L) 01/10/2022   LDLCALC 82 01/10/2022   ALT 46 (H) 01/10/2022   AST 32 01/10/2022   NA 143 01/10/2022   K 4.2 01/10/2022   CL 104 01/10/2022   CREATININE 1.03 01/10/2022   BUN 16 01/10/2022   CO2 23 01/10/2022   TSH 0.59 01/29/2018   HGBA1C 5.3 01/10/2022     Assessment & Plan:   Problem List Items Addressed This Visit       Other   Pleuritic pain - Primary    Patient endorsing chronic lower rib pain which is worse with inhalation.  Labs and chest x-ray for further evaluation today.  He is overall well-appearing and is nontender on exam.       Relevant Orders   DG Chest 2 View (Completed)   RESOLVED: LFT elevation   Relevant Orders   CMP14+EGFR (Completed)   Epigastric pain    Patient endorsing some epigastric discomfort with eating.  Patient denies alcohol.  No significant tenderness on exam.  Proceeding with laboratory work-up today.       Relevant Orders   CBC (Completed)   Other Visit Diagnoses     Blood glucose elevated       Relevant Orders   Hemoglobin A1c (Completed)   Screening, lipid       Relevant Orders   Lipid panel (Completed)      Follow-up:  Pending Work up  [place]

## 2022-01-12 NOTE — Assessment & Plan Note (Signed)
Patient endorsing some epigastric discomfort with eating.  Patient denies alcohol.  No significant tenderness on exam.  Proceeding with laboratory work-up today.

## 2022-01-31 ENCOUNTER — Ambulatory Visit (HOSPITAL_COMMUNITY)
Admission: RE | Admit: 2022-01-31 | Discharge: 2022-01-31 | Disposition: A | Discharge status: Home / Self Care | Payer: PRIVATE HEALTH INSURANCE | Source: Ambulatory Visit | Attending: Family Medicine | Admitting: Family Medicine

## 2022-01-31 DIAGNOSIS — R1013 Epigastric pain: Secondary | ICD-10-CM | POA: Diagnosis present

## 2022-02-26 ENCOUNTER — Encounter: Payer: Self-pay | Admitting: Family Medicine

## 2022-02-26 ENCOUNTER — Ambulatory Visit (INDEPENDENT_AMBULATORY_CARE_PROVIDER_SITE_OTHER): Payer: PRIVATE HEALTH INSURANCE | Admitting: Family Medicine

## 2022-02-26 DIAGNOSIS — R1012 Left upper quadrant pain: Secondary | ICD-10-CM

## 2022-02-26 MED ORDER — PANTOPRAZOLE SODIUM 40 MG PO TBEC: 40.0000 mg | DELAYED_RELEASE_TABLET | Freq: Every day | ORAL | 1 refills | Status: DC

## 2022-02-26 NOTE — Patient Instructions (Signed)
Give the medication a try.   If this has not helped after taking for 4-6 weeks, let me know and we will discuss referral.  Take care  Dr. Adriana Simas

## 2022-02-27 DIAGNOSIS — R1012 Left upper quadrant pain: Secondary | ICD-10-CM | POA: Insufficient documentation

## 2022-02-27 NOTE — Progress Notes (Signed)
Subjective:  Patient ID: Daniel Phelps, male    DOB: 09-30-1985  Age: 36 y.o. MRN: [id number]  CC: Chief Complaint  Patient presents with   Abdominal Pain    Left side pain that has began to thob, moves to right side at time. Urinary frequency.     HPI:  36 year old male presents with continued complaints of the above.  Patient has longstanding history of pain of the left lower quadrant/left lateral ribs.  I have previously seen him for this.  He has had a negative chest x-ray, unrevealing laboratory studies, and negative abdominal ultrasound.  The pain does occasionally go to the right side as well.  He states that his pain is worse when he takes a deep breath.  also worse when he eats certain foods particulate white bread.  He describes the pain as a throbbing sensation.  He states that it feels like a "open wound that has not healed".  He continues to endorse "dry stool".  Difficulty getting him to further explain this.  He states that he uses Preparation H as the skin around his anus is irritated.  He denies any constipation.  States that he has regular bowel movements.  He is troubled by his symptoms and the fact that a cause has not been found.  Patient Active Problem List   Diagnosis Date Noted   LUQ pain 02/27/2022   Class 1 obesity with body mass index (BMI) of 30.0 to 30.9 in adult 02/24/2018   Anxiety and depression 02/24/2018   Alcohol consumption binge drinking 02/24/2018   Chronic back pain 01/29/2018    Social Hx   Social History   Socioeconomic History   Marital status: Divorced    Spouse name: Not on file   Number of children: 2   Years of education: Not on file   Highest education level: Not on file  Occupational History   Not on file  Tobacco Use   Smoking status: Never   Smokeless tobacco: Never  Vaping Use   Vaping Use: Never used  Substance and Sexual Activity   Alcohol use: Not Currently    Alcohol/week: 6.0 standard drinks of alcohol    Types: 6 Shots  of liquor per week    Comment: last drink 2 days ago. usually drinks 3 days a week.   Drug use: Yes    Types: Marijuana    Comment: last used yesterday   Sexual activity: Not Currently  Other Topics Concern   Not on file  Social History Narrative   Not on file   Social Determinants of Health   Financial Resource Strain: Not on file  Food Insecurity: Not on file  Transportation Needs: Not on file  Physical Activity: Unknown (02/24/2018)   Exercise Vital Sign    Days of Exercise per Week: 0 days    Minutes of Exercise per Session: Not on file  Stress: Not on file  Social Connections: Not on file    Review of Systems Per HPI  Objective:  BP (!) 130/91   Pulse 63   Temp 98 F (36.7 C)   Wt 238 lb 9.6 oz (108.2 kg)   SpO2 98%   BMI 30.63 kg/m      02/26/2022    2:45 PM 01/10/2022    2:15 PM 11/22/2020   10:58 AM  BP/Weight  Systolic BP 130 132 138  Diastolic BP 91 84 88  Wt. (Lbs) 238.6 234.4 233.4  BMI 30.63 kg/m2 30.1 kg/m2 29.97  kg/m2    Physical Exam Constitutional:      Appearance: Normal appearance.  HENT:     Head: Normocephalic and atraumatic.  Cardiovascular:     Rate and Rhythm: Normal rate and regular rhythm.  Pulmonary:     Effort: Pulmonary effort is normal.     Breath sounds: Normal breath sounds. No wheezing, rhonchi or rales.  Abdominal:       Comments: Patient has a reported discrete area of tenderness over the labeled location.  Nontender to palpation.  Abdominal exam is benign.  Abdomen is soft, nondistended, nontender.  Neurological:     Mental Status: He is alert.  Psychiatric:     Comments: Flat affect.     Lab Results  Component Value Date   WBC 7.7 01/10/2022   HGB 15.6 01/10/2022   HCT 45.5 01/10/2022   PLT 256 01/10/2022   GLUCOSE 84 01/10/2022   CHOL 149 01/10/2022   TRIG 195 (H) 01/10/2022   HDL 34 (L) 01/10/2022   LDLCALC 82 01/10/2022   ALT 46 (H) 01/10/2022   AST 32 01/10/2022   NA 143 01/10/2022   K 4.2  01/10/2022   CL 104 01/10/2022   CREATININE 1.03 01/10/2022   BUN 16 01/10/2022   CO2 23 01/10/2022   TSH 0.59 01/29/2018   HGBA1C 5.3 01/10/2022     Assessment & Plan:   Problem List Items Addressed This Visit       Other   LUQ pain    Left lower quadrant pain/left-sided rib pain which also migrates to the right side. Exam is benign.  He has had a chest x-ray, laboratory studies, and ultrasound which have all been negative. His pain seems to be worse with certain foods. ?  Atypical GERD or gastritis.  This could be musculoskeletal as well.  This is chronic and longstanding.  I am also concerned that this is a psychosomatic complaint. Trial of Protonix.        Meds ordered this encounter  Medications   pantoprazole (PROTONIX) 40 MG tablet    Sig: Take 1 tablet (40 mg total) by mouth daily.    Dispense:  90 tablet    Refill:  1   Tyheem Boughner DO [facility]

## 2022-02-27 NOTE — Assessment & Plan Note (Signed)
Left lower quadrant pain/left-sided rib pain which also migrates to the right side. Exam is benign.  He has had a chest x-ray, laboratory studies, and ultrasound which have all been negative. His pain seems to be worse with certain foods. ?  Atypical GERD or gastritis.  This could be musculoskeletal as well.  This is chronic and longstanding.  I am also concerned that this is a psychosomatic complaint. Trial of Protonix.

## 2022-08-02 IMAGING — DX DG CHEST 2V
2 series · 2 of 2 positions shown · non-contrast
Comparison: 09/11/2010

CLINICAL DATA: BILATERAL chronic rib pain, LEFT side chest pain for
20 years

EXAM:
CHEST - 2 VIEW

[chest pa]
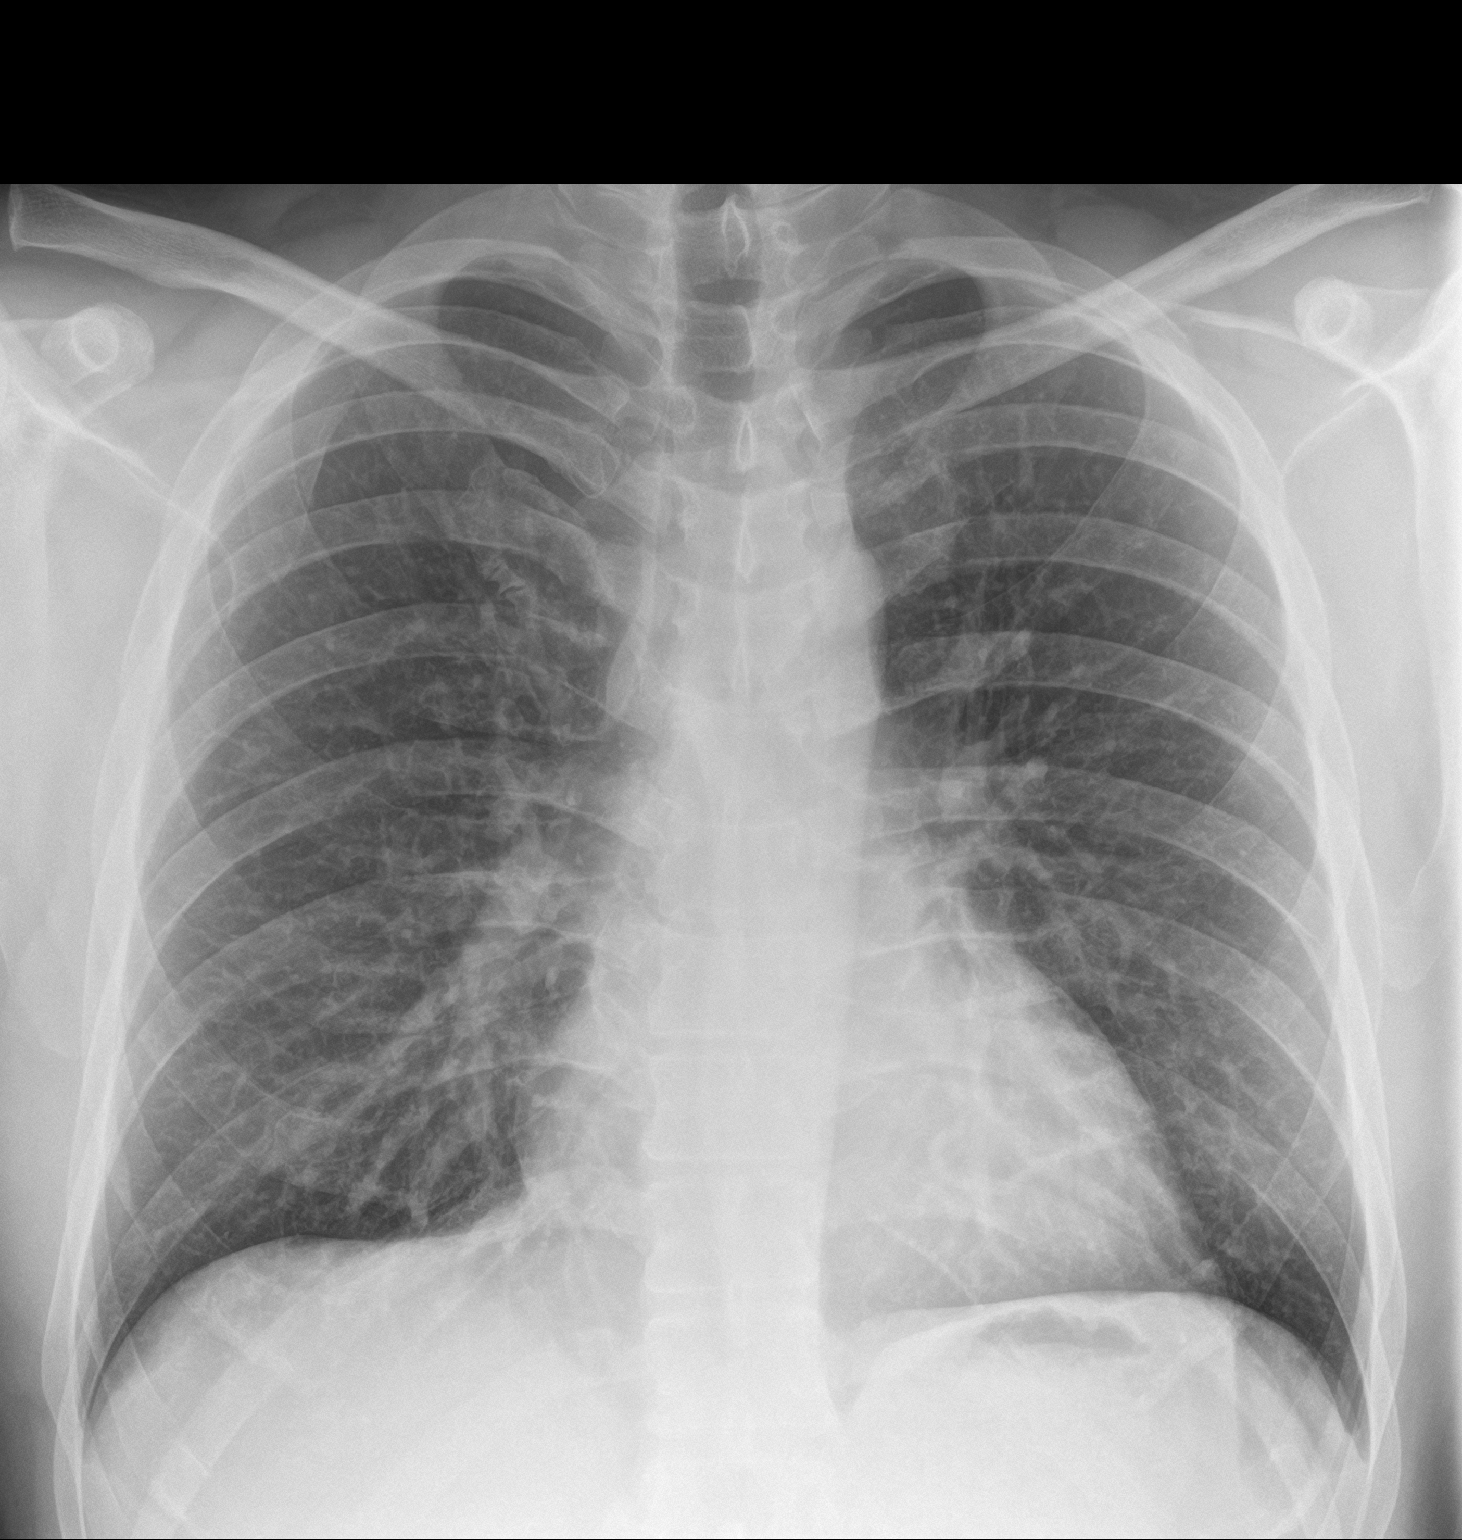

[chest lat]
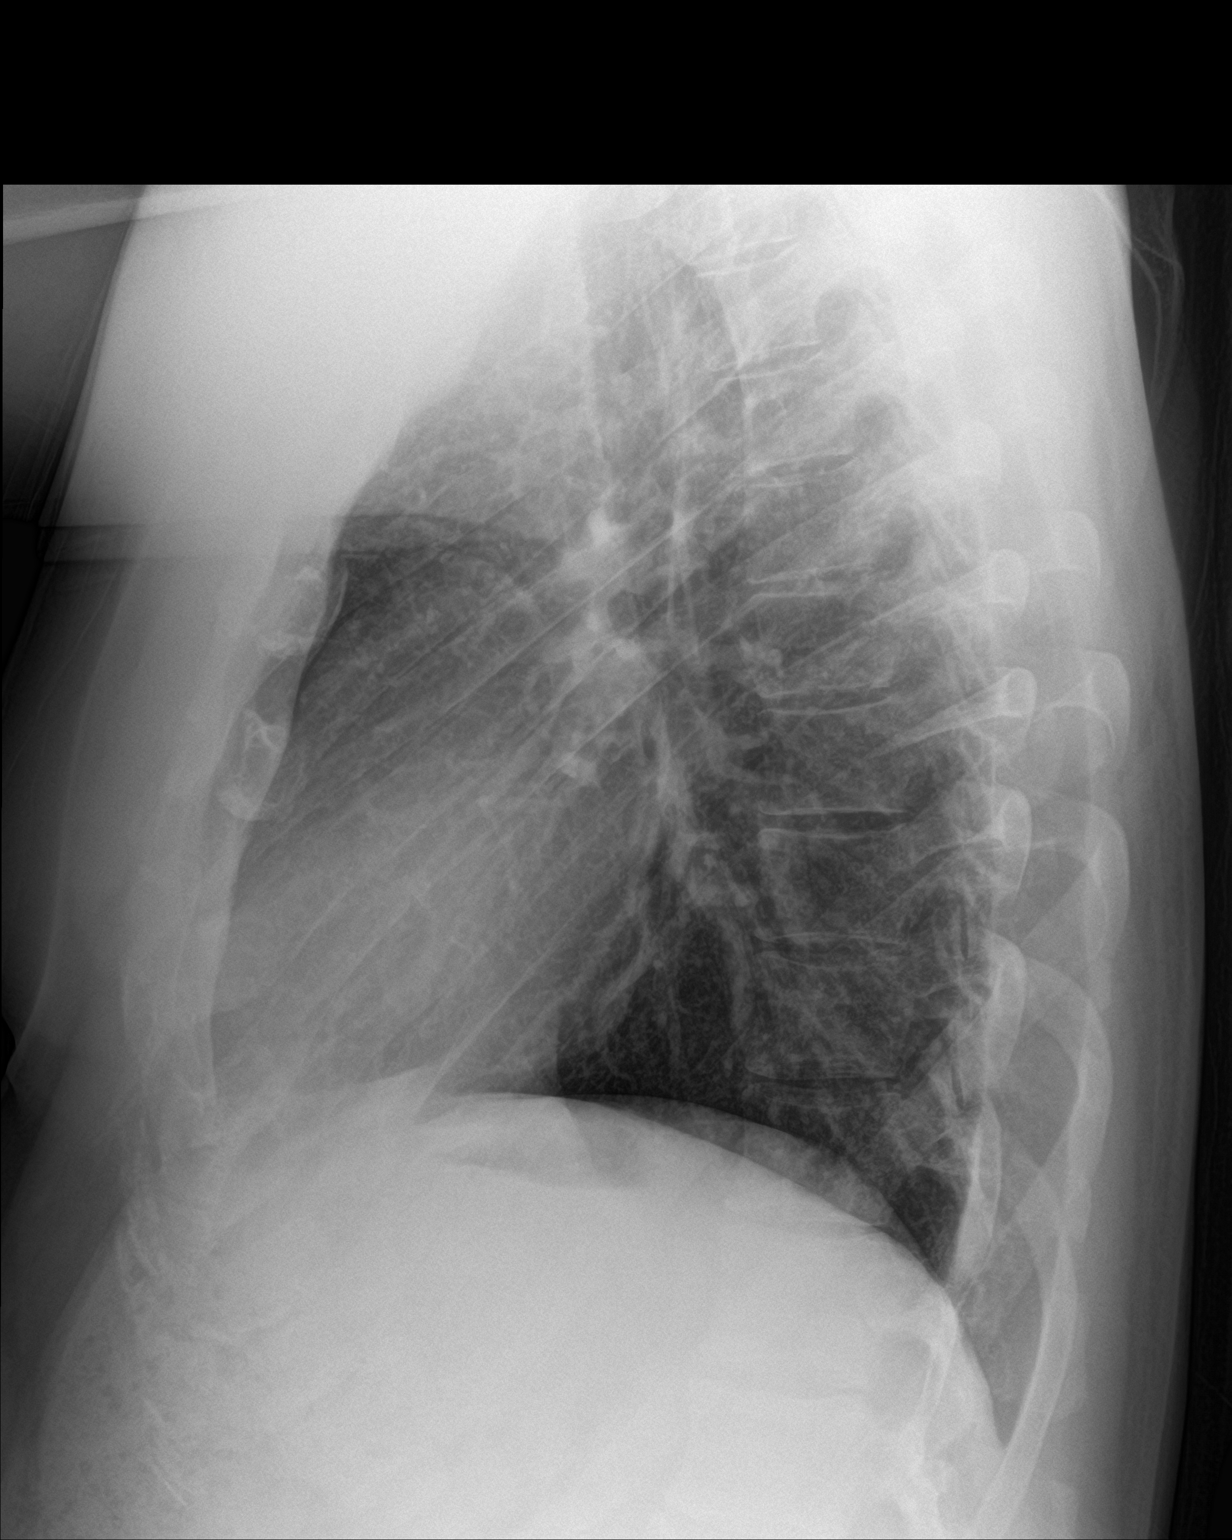

[2 of 2 positions shown; findings below may reference images not displayed]

FINDINGS: Normal heart size, mediastinal contours, and pulmonary vascularity.

Lungs clear.

No pleural effusion or pneumothorax.

Bones unremarkable.
IMPRESSION: Normal exam.

## 2022-08-12 ENCOUNTER — Encounter: Payer: Self-pay | Admitting: Family Medicine

## 2022-08-12 ENCOUNTER — Ambulatory Visit (INDEPENDENT_AMBULATORY_CARE_PROVIDER_SITE_OTHER): Payer: PRIVATE HEALTH INSURANCE | Admitting: Family Medicine

## 2022-08-12 DIAGNOSIS — J209 Acute bronchitis, unspecified: Secondary | ICD-10-CM | POA: Diagnosis not present

## 2022-08-12 DIAGNOSIS — R1012 Left upper quadrant pain: Secondary | ICD-10-CM

## 2022-08-12 MED ORDER — PROMETHAZINE-DM 6.25-15 MG/5ML PO SYRP: 5.0000 mL | ORAL_SOLUTION | Freq: Four times a day (QID) | ORAL | 0 refills | Status: DC | PRN

## 2022-08-12 MED ORDER — AMOXICILLIN-POT CLAVULANATE 875-125 MG PO TABS: 1.0000 | ORAL_TABLET | Freq: Two times a day (BID) | ORAL | 0 refills | Status: DC

## 2022-08-12 NOTE — Patient Instructions (Signed)
Medications as prescribed.  In regards to your chronic rib pain, I would suggest seeing a specialist (I don't believe I will be able to get an MRI approved through insurance for this issue/complaint).  Take care  Dr. Lacinda Axon

## 2022-08-12 NOTE — Assessment & Plan Note (Signed)
Treating with Augmentin and Promethazine DM. 

## 2022-08-12 NOTE — Progress Notes (Signed)
Subjective:  Patient ID: Daniel Phelps, male    DOB: 03/04/1986  Age: 37 y.o. MRN: [id number]  CC: Chief Complaint  Patient presents with   Cough    Pt arrives with cough (when standing up) and mucus. Pt states this has been going on about 3 weeks; believes he has overcame the worst part. Pt states mucus is both clear and colored. Ran fever on Christmas night. Was around someone that tested positive for Flu A. Has been taking Dayquil. Constant Drainage    HPI:  37 year old male present for evaluation of the above.  Patient reports that he has been sick for the past 3 weeks.  He has been exposed to influenza and strep.  He reports ongoing cough which is productive.  He has had some bodyaches.  Subjective fever.  He has not taken his temperature.  No shortness of breath.  Has been taking over-the-counter DayQuil.  He is troubled by his symptoms as he has not improved.  Patient also notes that he continues to have left upper quadrant/left rib pain.  He states that he now has insurance.  He would like an MRI evaluation to determine the source of his pain.  He has had negative x-ray imaging as well as ultrasound imaging.  Patient Active Problem List   Diagnosis Date Noted   Acute bronchitis 08/12/2022   LUQ pain 02/27/2022   Class 1 obesity with body mass index (BMI) of 30.0 to 30.9 in adult 02/24/2018   Anxiety and depression 02/24/2018   Chronic back pain 01/29/2018    Social Hx   Social History   Socioeconomic History   Marital status: Divorced    Spouse name: Not on file   Number of children: 2   Years of education: Not on file   Highest education level: Not on file  Occupational History   Not on file  Tobacco Use   Smoking status: Never   Smokeless tobacco: Never  Vaping Use   Vaping Use: Never used  Substance and Sexual Activity   Alcohol use: Not Currently    Alcohol/week: 6.0 standard drinks of alcohol    Types: 6 Shots of liquor per week    Comment: last drink 2 days  ago. usually drinks 3 days a week.   Drug use: Yes    Types: Marijuana    Comment: last used yesterday   Sexual activity: Not Currently  Other Topics Concern   Not on file  Social History Narrative   Not on file   Social Determinants of Health   Financial Resource Strain: Not on file  Food Insecurity: Not on file  Transportation Needs: Not on file  Physical Activity: Unknown (02/24/2018)   Exercise Vital Sign    Days of Exercise per Week: 0 days    Minutes of Exercise per Session: Not on file  Stress: Not on file  Social Connections: Not on file    Review of Systems Per HPI  Objective:  BP 114/60   Pulse 72   Temp (!) 97.2 F (36.2 C)   Wt 240 lb 3.2 oz (109 kg)   SpO2 97%   BMI 30.84 kg/m      08/12/2022   10:54 AM 02/26/2022    2:45 PM 01/10/2022    2:15 PM  BP/Weight  Systolic BP 341 962 229  Diastolic BP 60 91 84  Wt. (Lbs) 240.2 238.6 234.4  BMI 30.84 kg/m2 30.63 kg/m2 30.1 kg/m2    Physical Exam Vitals and  nursing note reviewed.  Constitutional:      General: He is not in acute distress.    Appearance: Normal appearance.  HENT:     Head: Normocephalic and atraumatic.     Right Ear: Tympanic membrane normal.     Left Ear: Tympanic membrane normal.     Mouth/Throat:     Pharynx: Oropharynx is clear.  Eyes:     General:        Right eye: No discharge.        Left eye: No discharge.     Conjunctiva/sclera: Conjunctivae normal.  Cardiovascular:     Rate and Rhythm: Normal rate and regular rhythm.  Pulmonary:     Effort: Pulmonary effort is normal.     Breath sounds: Normal breath sounds. No wheezing, rhonchi or rales.  Neurological:     Mental Status: He is alert.     Lab Results  Component Value Date   WBC 7.7 01/10/2022   HGB 15.6 01/10/2022   HCT 45.5 01/10/2022   PLT 256 01/10/2022   GLUCOSE 84 01/10/2022   CHOL 149 01/10/2022   TRIG 195 (H) 01/10/2022   HDL 34 (L) 01/10/2022   LDLCALC 82 01/10/2022   ALT 46 (H) 01/10/2022   AST 32  01/10/2022   NA 143 01/10/2022   K 4.2 01/10/2022   CL 104 01/10/2022   CREATININE 1.03 01/10/2022   BUN 16 01/10/2022   CO2 23 01/10/2022   TSH 0.59 01/29/2018   HGBA1C 5.3 01/10/2022     Assessment & Plan:   Problem List Items Addressed This Visit       Respiratory   Acute bronchitis    Treating with Augmentin and Promethazine DM.        Other   LUQ pain    Has chronic left upper quadrant pain/rib pain. I do not feel that this is GI in origin. Referral has been placed to pain management.      Relevant Orders   Ambulatory referral to Pain Clinic    Meds ordered this encounter  Medications   amoxicillin-clavulanate (AUGMENTIN) 875-125 MG tablet    Sig: Take 1 tablet by mouth 2 (two) times daily.    Dispense:  14 tablet    Refill:  0   promethazine-dextromethorphan (PROMETHAZINE-DM) 6.25-15 MG/5ML syrup    Sig: Take 5 mLs by mouth 4 (four) times daily as needed for cough.    Dispense:  118 mL    Refill:  [place]

## 2022-08-12 NOTE — Assessment & Plan Note (Signed)
Has chronic left upper quadrant pain/rib pain. I do not feel that this is GI in origin. Referral has been placed to pain management.

## 2022-08-15 ENCOUNTER — Other Ambulatory Visit: Payer: Self-pay | Admitting: *Deleted

## 2022-08-15 ENCOUNTER — Other Ambulatory Visit: Payer: Self-pay | Admitting: Family Medicine

## 2022-08-15 DIAGNOSIS — R1013 Epigastric pain: Secondary | ICD-10-CM

## 2022-08-15 DIAGNOSIS — R1012 Left upper quadrant pain: Secondary | ICD-10-CM

## 2022-08-23 IMAGING — US US ABDOMEN COMPLETE
1 series · 14 of 25 positions shown · non-contrast
Comparison: None Available.

CLINICAL DATA: Epigastric pain

EXAM:
ABDOMEN ULTRASOUND COMPLETE

[Series 1: us abdomen complete · 14 of 138 slices shown]
[im 1/138]
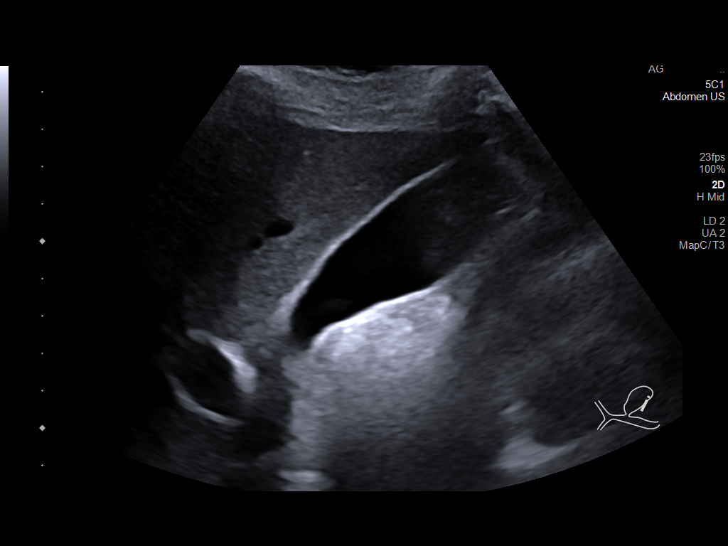
[im 12/138]
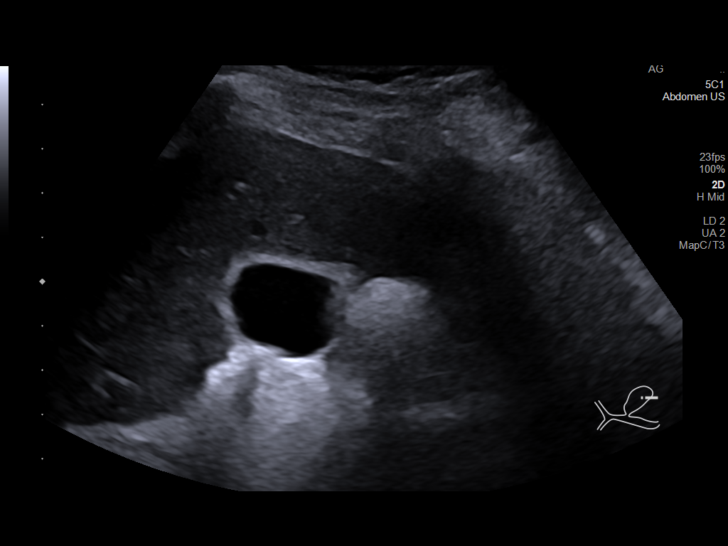
[im 23/138]
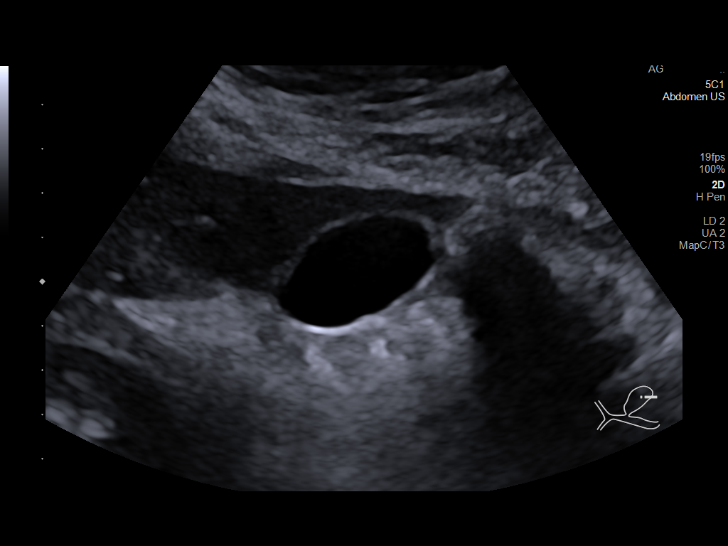
[im 35/138]
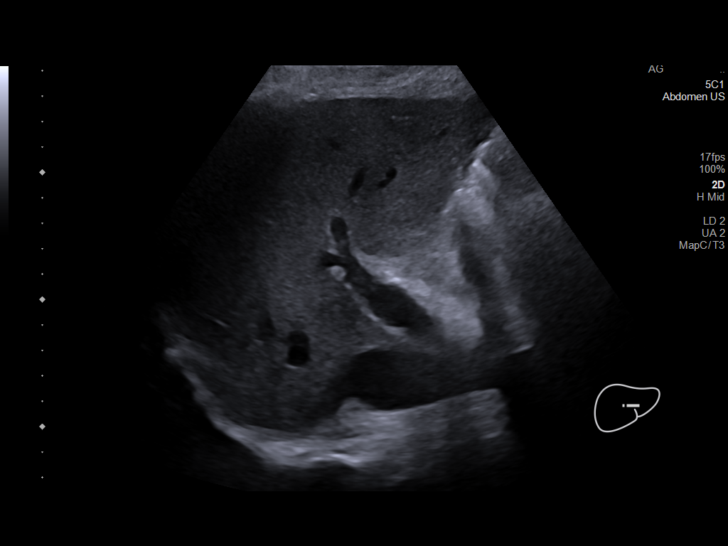
[im 46/138]
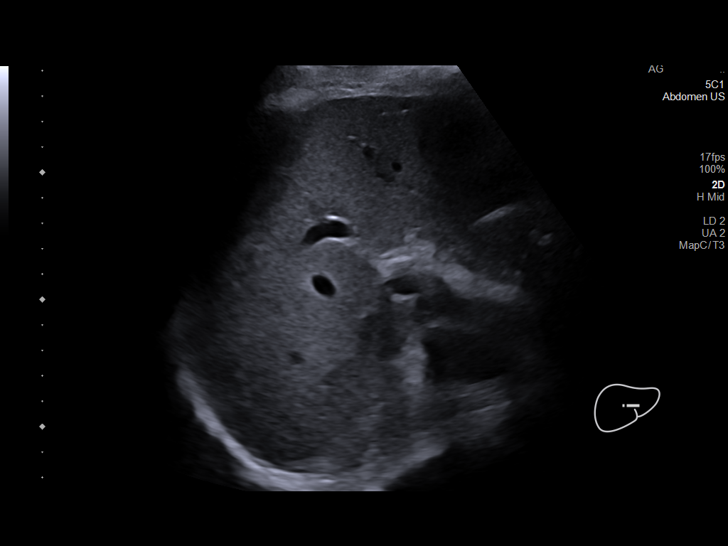
[im 52/138]
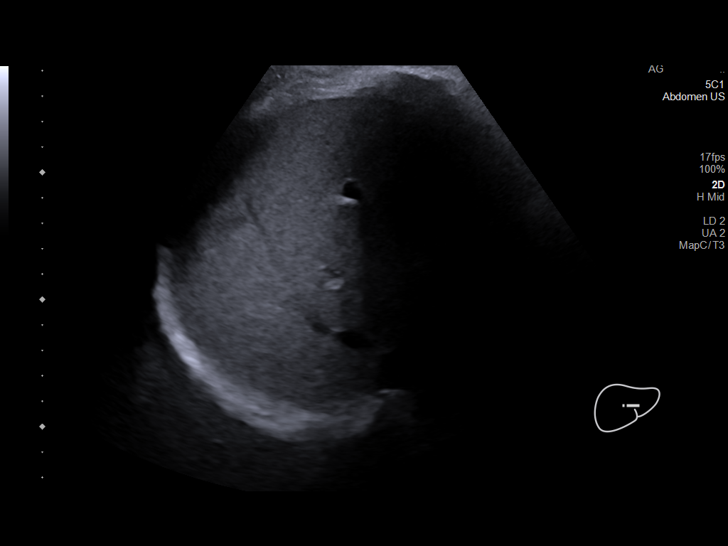
[im 63/138]
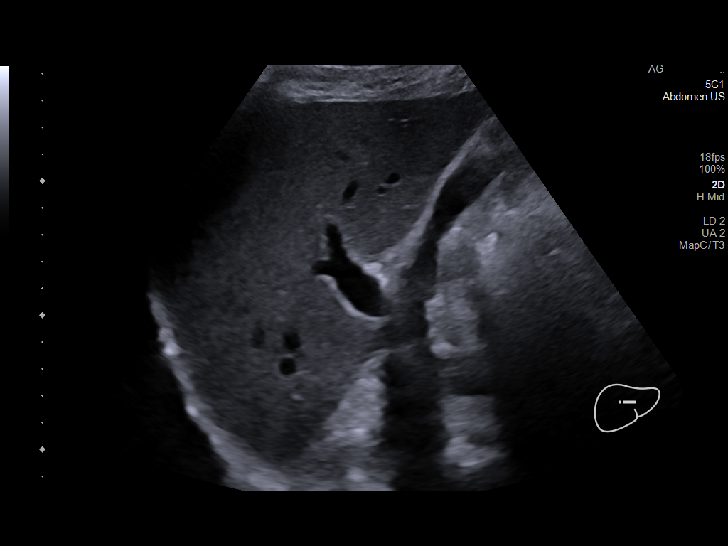
[im 75/138]
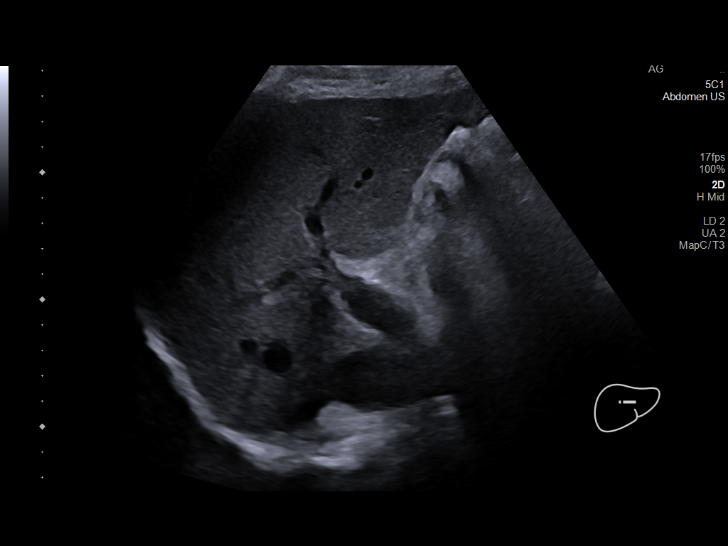
[im 86/138]
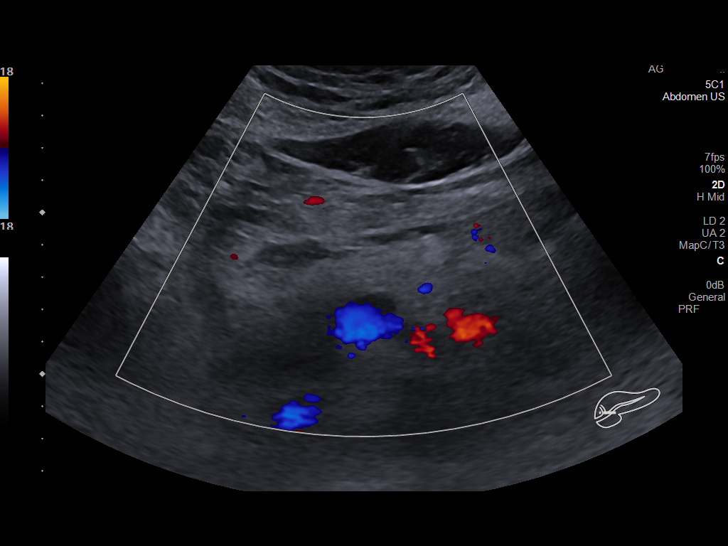
[im 92/138]
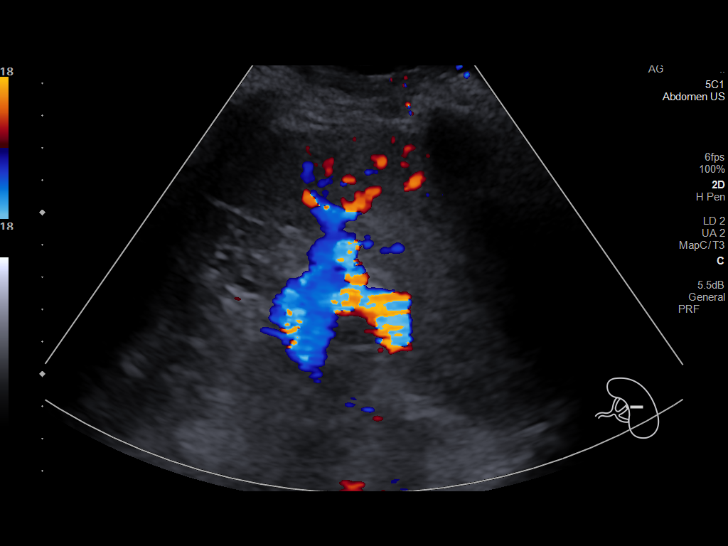
[im 103/138]
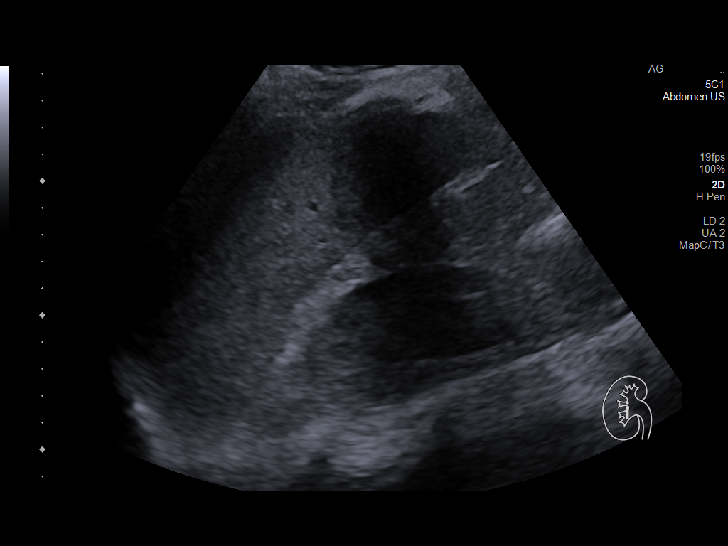
[im 115/138]
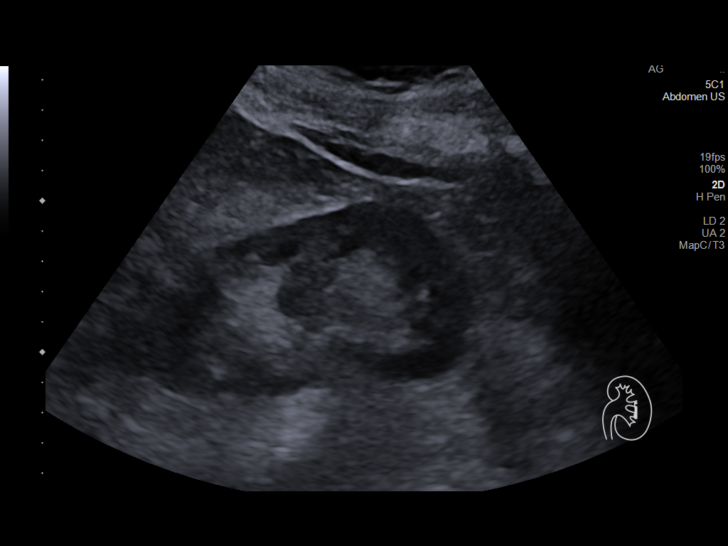
[im 126/138]
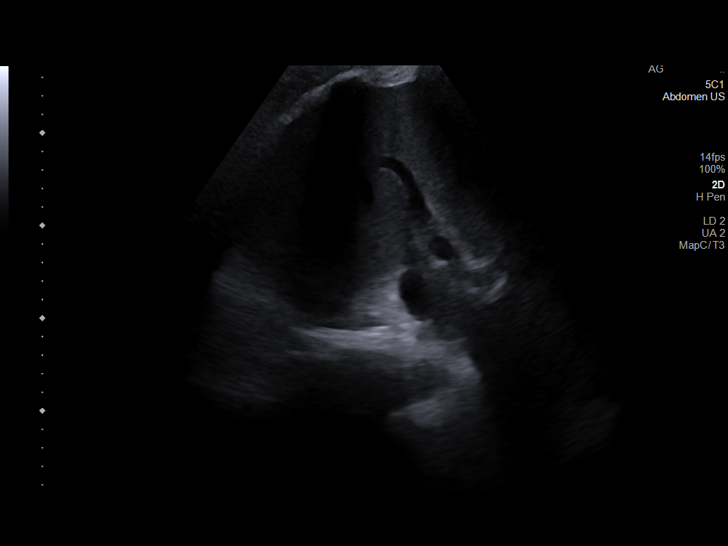
[im 138/138]
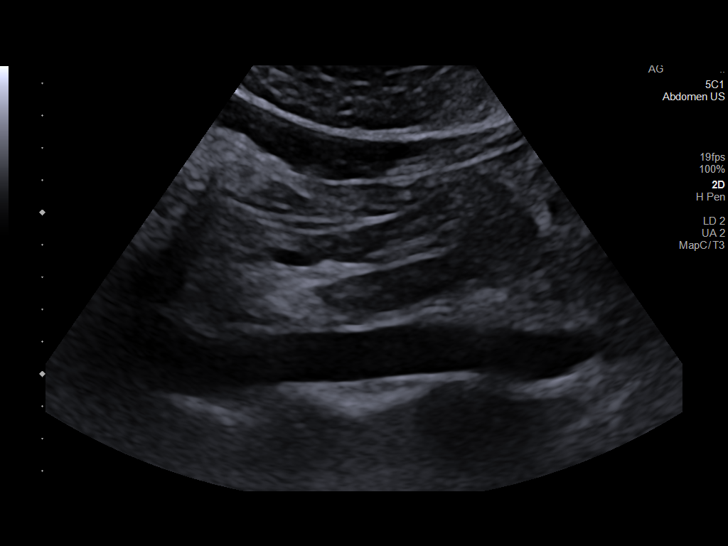

[14 of 25 positions shown; findings below may reference images not displayed]

FINDINGS: Gallbladder: No gallstones or wall thickening visualized. No
sonographic Murphy sign noted by sonographer.

Common bile duct: Diameter: 2 mm

Liver: No focal lesion identified. Within normal limits in
parenchymal echogenicity. Portal vein is patent on color Doppler
imaging with normal direction of blood flow towards the liver.

IVC: No abnormality visualized.

Pancreas: Visualized portion unremarkable.

Spleen: Size and appearance within normal limits.

Right Kidney: Length: 11.7 cm. Echogenicity within normal limits. No
mass or hydronephrosis visualized.

Left Kidney: Length: 11.3 cm. Echogenicity within normal limits. No
mass or hydronephrosis visualized.

Abdominal aorta: No aneurysm visualized.

Other findings: None.
IMPRESSION: Unremarkable examination.

## 2022-08-29 NOTE — Telephone Encounter (Signed)
Erroneous encounter. Please disregard.

## 2022-09-10 ENCOUNTER — Ambulatory Visit (HOSPITAL_COMMUNITY)
Admission: EM | Admit: 2022-09-10 | Discharge: 2022-09-10 | Disposition: A | Discharge status: Home / Self Care | Payer: 59

## 2022-09-10 DIAGNOSIS — R440 Auditory hallucinations: Secondary | ICD-10-CM

## 2022-09-10 NOTE — ED Provider Notes (Addendum)
Behavioral Health Urgent Care Medical Screening Exam  Patient Name: Daniel Phelps MRN: [id number] Date of Evaluation: 09/10/22 Chief Complaint:   Diagnosis:  Final diagnoses:  Auditory hallucination    History of Present illness: Daniel Phelps is a 37 y.o. male. Patient presents voluntarily to [facility] behavioral health for walk-in assessment.  Patient is assessed, face-to-face, by nurse practitioner. He is seated in assessment area, no acute distress. Consulted with provider, Dr.  Dwyane Dee, and chart reviewed on 09/10/2022. He  is alert and oriented, pleasant and cooperative during assessment.   Daniel Phelps reports "I hear people talking to me for the last 3 years."  He reports hearing the voice of people he knows "talking down to me."  Patient reports voice has stated "you need to move, you should not live in a trailer and other negative thoughts."  No auditory hallucinations currently.  Aside from auditory hallucinations patient reports vivid dreams that sometimes wake him from sleep.  Patient denies current prescription medications.  Two weeks ago he began in supplements including magnesium and a multivitamin.  He also uses ginkgo, a seaweed supplement and Lions mane ( a mushroom derivative substance), these are to improve mental focus and clarity.  Patient will consider discontinuing new supplements until opportunity to follow-up with primary care provider.  Per medical record patient diagnosed with anxiety and depression.  He has been [facility] at [facility] [place] [facility] time, seeking additional outpatient psychiatry resources.  There was an error made in the schedule and patient was told to be seen on a holiday, Daniel Phelps day.  Would prefer alternatives for outpatient psychiatry follow-up.  Patient  presents with euthymic mood, congruent affect. He  denies suicidal and homicidal ideations. Denies history of suicide attempts, denies history of non suicidal self-harm behaviors.  Patient  easily  contracts verbally for safety with this Probation officer.    Patient has normal speech and behavior.  He  denies visual hallucinations.  Patient is able to converse coherently with goal-directed thoughts and no distractibility or preoccupation.   Objectively there is no evidence of psychosis/mania or delusional thinking.  Grey resides in [place] with his ex-wife and three children. He has one secured weapon in his home, he verbalizes plan to have this weapon removed to the home of his aunt. Patient endorses average appetite. He is employed as a Corporate treasurer. He denies recent alcohol and substance use. Hx of alcohol use disorder and marijuana use. Most recent marijuana use one year ago, most recent alcohol use one year ago.   Patient offered support and encouragement. He declines any person to contact for collateral information currently. Prefers to contact his aunt directly regarding removing weapon from his home.    Patient educated and verbalizes understanding of mental health resources and other crisis services in the community. They are instructed to call 911 and present to the nearest emergency room should patient experience any suicidal/homicidal ideation, auditory/visual/hallucinations, or detrimental worsening of mental health condition.      [place] ED from 09/10/2022 in [facility] ED from 11/17/2020 in [facility] Emergency Department at [facility] ED from 05/23/2018 in [facility] Emergency Department at [place] No Risk No Risk No Risk       Psychiatric Specialty Exam  Presentation  General Appearance:Appropriate for Environment; Casual  Eye Contact:Good  Speech:Clear and Coherent; Normal Rate  Speech Volume:Normal  Handedness:Right   Mood and Affect  Mood: Euthymic  Affect: Appropriate; Congruent   Thought Process  Thought Processes: Coherent; Goal Directed; Linear  Descriptions of  Associations:Intact  Orientation:Full (Time, Place and Person)  Thought Content:Logical; WDL  Diagnosis of Schizophrenia or Schizoaffective disorder in past: No data recorded  Hallucinations:Auditory "I hear people talking down to me"  Ideas of Reference:None  Suicidal Thoughts:No  Homicidal Thoughts:No   Sensorium  Memory: Immediate Good; Recent Good  Judgment: Good  Insight: Fair   Executive Functions  Concentration: Good  Attention Span: Good  Recall: Good  Fund of Knowledge: Good  Language: Good   Psychomotor Activity  Psychomotor Activity: Normal   Assets  Assets: Communication Skills; Desire for Improvement; Financial Resources/Insurance; Housing; Intimacy; Leisure Time; Physical Health; Resilience; Social Support   Sleep  Sleep: Fair  Number of hours: No data recorded  Physical Exam: Physical Exam Vitals and nursing note reviewed.  Constitutional:      Appearance: Normal appearance. He is well-developed and normal weight.  HENT:     Head: Normocephalic and atraumatic.     Nose: Nose normal.  Cardiovascular:     Rate and Rhythm: Normal rate.  Pulmonary:     Effort: Pulmonary effort is normal.  Musculoskeletal:        General: Normal range of motion.     Cervical back: Normal range of motion.  Skin:    General: Skin is warm and dry.  Neurological:     Mental Status: He is alert and oriented to person, place, and time.  Psychiatric:        Attention and Perception: Attention normal. He perceives auditory hallucinations.        Mood and Affect: Mood and affect normal.        Speech: Speech normal.        Behavior: Behavior normal. Behavior is cooperative.        Thought Content: Thought content normal.        Cognition and Memory: Cognition and memory normal.    Review of Systems  Constitutional: Negative.   HENT: Negative.    Eyes: Negative.   Respiratory: Negative.    Cardiovascular: Negative.   Gastrointestinal:  Negative.   Genitourinary: Negative.   Musculoskeletal: Negative.   Skin: Negative.   Neurological: Negative.   Psychiatric/Behavioral:  Positive for hallucinations.    Blood pressure 119/87, pulse 75, temperature 98.2 F (36.8 C), temperature source Oral, resp. rate 18, height 6\' 2"  (1.88 m), weight 230 lb (104.3 kg), SpO2 98 %. Body mass index is 29.53 kg/m.  Musculoskeletal: Strength & Muscle Tone: within normal limits Gait & Station: normal Patient leans: N/A   [place] MSE Discharge Disposition for Follow up and Recommendations: Based on my evaluation the patient does not appear to have an emergency medical condition and can be discharged with resources and follow up care in outpatient services for Medication Management and Individual Therapy Follow-up with outpatient psychiatry, resources provided.  Lucky Rathke, FNP 09/10/2022, 10:58 AM

## 2022-09-10 NOTE — Progress Notes (Signed)
   09/10/22 0940  [place] Triage Screening (Walk-ins at [facility] only)  How Did You Hear About [place]? Other (Comment) ([facility])  What Is the Reason for Your Visit/Call Today? Daniel Phelps is a 37 year old male presenting to [facility] voluntarily with chief complaint of "people talking to me in the sky. Saying negative things, threaten to blow my head off. Their goal is to ruin my life". Pt reports having AH for three years but has never been diagnosed with a mental illness. Pt reports he went to [facility] this morning and they told him to come here for "schizophrenia medications". Pt denies SI, HI, VH and drug use. Pt reports he works and takes care of his family including ex-wife and three kids.  How Long Has This Been Causing You Problems? > than 6 months  Have You Recently Had Any Thoughts About Hurting Yourself? No  Are You Planning to Commit Suicide/Harm Yourself At This time? No  Have you Recently Had Thoughts About [place]? No  Are You Planning To Harm Someone At This Time? No  Are you currently experiencing any auditory, visual or other hallucinations? Yes  Please explain the hallucinations you are currently experiencing: hearing some voices now, reports the voices are also listening  Have You Used Any Alcohol or Drugs in the Past 24 Hours? No  Do you have any current medical co-morbidities that require immediate attention? No  Clinician description of patient physical appearance/behavior: calm, engaging  What Do You Feel Would Help You the Most Today? Treatment for Depression or other mood problem  If access to [facility] Urgent Care was not available, would you have sought care in the Emergency Department? No  Determination of Need Routine (7 days)  Options For Referral Medication Management;Outpatient Therapy

## 2022-09-10 NOTE — Discharge Instructions (Addendum)
Outpatient Services for Therapy and Medication Management   Based on what you have shared, a list of resources for outpatient therapy and psychiatry is provided below to get you started back on treatment.  It is imperative that you follow through with treatment within 5-7 days from the day of discharge to prevent any further risk to your safety or mental well-being.  You are not limited to the list provided.  In case of an urgent crisis, you may contact the Mobile Crisis Unit with [facility] at [phone].         [facility] [place], [place], [address] [phone] phone  New Patient Assessment/Therapy Walk-ins Monday and Wednesday: 8am until slots are full. Every 1st and 2nd Friday: 1pm - 5pm  NO ASSESSMENT/THERAPY WALK-INS ON [place]  New Patient Psychiatry/Medication Management Walk-ins Monday-Friday: 8am-11am  For all walk-ins, we ask that you arrive by 7:30am because patient will be seen in the order of arrival.  Availability is limited; therefore, you may not be seen on the same day that you walk-in.  Our goal is to serve and meet the needs of our community to the best of our ability.   [facility] [address] [place], [place], [address] [phone] phone  [facility], [place], [place], [address] [phone] phone [phone] phone ([address], AmeriHealth, Anthem/Elevance, BCBS, [place], [place], ComPsych, Healthy [place], [place], [place], [place], [place], [employer] Company, [place], Out of Network)  [employer], [address] [place], [place], [address] [phone] phone ([place], Anthem/Elevance, [employer] Options/[place], [place], [place], [place], [place], [place], [employer], [place], [place], [place], [facility])  [facility] [address] [place], [place], [address] [phone] phone (Medicaid, ask about other insurance)  [facility] [address], [place], [place], [address] [phone] phone [phone] fax ([address], [place] , [place], [place], [place], UHC, [employer], Self-Pay)  Evangeline Dakin [place]. [place], [place], [address] [phone] phone ([address] Anthem/Elevance, [place], [place], [place], [place], [place], [employer], [place], [place], [place], [facility])  [place] - 6-8 MONTH WAIT FOR THERAPY; SOONER FOR MEDICATION MANAGEMENT [address], [place], [place], [address] [phone] phone ([address], [place], [place], [place], [place], Friday Health Plans, [place], [place], [place], [place], [place], [place], Tricare, UHC, [employer], [place])  [facility] [address], [place], [place], [address] [phone] phone  [facility] [address] [place], [place], [address] [phone] phone  [facility] [address] [place], [place], [address] [phone] phone  [facility] [place] [facility] [address], [place], [address] [phone] phone  [facility], [place] [address], [place], [address] [phone] phone  [facility] [place] [place], [place], [address] [phone] phone [phone] fax  [facility] [address] [place], [place], [address] [phone] phone [phone] fax  [facility] [phone] [address] [place], [place], [address] [phone] phone  Jinny Blossom [address] [place], [place], [address]  [phone] phone  [facility]  (Adults Only) [address] [place], [place],  [address]  [phone] phone [phone] fax   Patient is instructed prior to discharge to:  Take all medications as prescribed by his/her mental healthcare provider. Report any adverse effects and or reactions from the medicines to his/her outpatient provider promptly. Keep all scheduled appointments, to ensure that you are getting refills on time and to avoid any interruption in your medication.  If you are unable to keep an appointment call to reschedule.  Be sure to follow-up with resources and follow-up appointments provided.  Patient has been instructed & cautioned: To not engage in alcohol and or illegal drug use while on prescription medicines. In the event of worsening symptoms, patient is instructed to call the crisis hotline, 911 and or go to the nearest ED for appropriate evaluation and treatment of symptoms. To follow-up with his/her primary care provider for your other medical issues, concerns and or health care needs.  Information: -National Suicide Prevention Lifeline 1-800-SUICIDE or (828) 073-2605.  -988 offers 24/7 access to trained crisis counselors who can help people experiencing mental health-related distress. People can call or text 988 or chat 988lifeline.org for themselves or if they are worried about a loved one who may need crisis support.

## 2022-09-11 ENCOUNTER — Encounter (HOSPITAL_COMMUNITY): Payer: Self-pay | Admitting: Student in an Organized Health Care Education/Training Program

## 2022-09-11 ENCOUNTER — Ambulatory Visit (INDEPENDENT_AMBULATORY_CARE_PROVIDER_SITE_OTHER): Payer: 59 | Admitting: Student in an Organized Health Care Education/Training Program

## 2022-09-11 VITALS — BP 125/78 | HR 65 | Resp 12 | Wt 240.0 lb

## 2022-09-11 DIAGNOSIS — F29 Unspecified psychosis not due to a substance or known physiological condition: Secondary | ICD-10-CM

## 2022-09-11 HISTORY — DX: Unspecified psychosis not due to a substance or known physiological condition: F29

## 2022-09-11 MED ORDER — QUETIAPINE FUMARATE 100 MG PO TABS: ORAL_TABLET | ORAL | 1 refills | Status: DC

## 2022-09-11 NOTE — Patient Instructions (Signed)
Medication Sent to:  [id number] [place], [address]  [place], [place] [address]

## 2022-09-11 NOTE — Progress Notes (Signed)
Psychiatric Initial Adult Assessment   Patient Identification: Daniel Phelps MRN:  [id number] Date of Evaluation:  09/11/2022 Referral Source: [facility] Chief Complaint:   Chief Complaint  Patient presents with   Establish Care   Visit Diagnosis:    ICD-10-CM   1. Schizophrenia spectrum disorder with psychotic disorder type not yet determined (HCC)  F29 QUEtiapine (SEROQUEL) 100 MG tablet      History of Present Illness:   Daniel Phelps is a 37 year old patient with a PPH of depression and auditory hallucinations and no prior medication trials.  Patient reports that he presents today seeking help due to 3 years of auditory hallucinations that are worsening.  Patient reports that he initially went to [facility] [place] and saw a therapist who was concern for schizophrenia and recommended he find medication management.  Patient reports that for the last 3 years, since his grandmother died, he hears 4-5 people in his head.  Patient reports he can recognize the voices as old friends or family members who were not deceased but he does not currently have much contact with.  Patient reports that he often hears voices more when he is alone.  Patient reports that the voices are more annoying than scary.  Patient reports that the voices most often tell him to move and that they will leave him alone if he does this.  Patient reports that he believes that these voices are being transmitted into him from the individuals due to a chip in his neck.  Patient reports that he read of similar experiences online in the context of "V2K" which she reports stands for voice to goal transmission.  Patient reports that at times the voices will get angry if he talks about them and he is nervous about giving specific names.  Patient reports he feels her main goal is to aggravate him enough to get and put into a mental institution and off of the family property that he lives on.  Patient reports he is  very reluctant to let people know that he hears voices but he has told 1 family member and that at one point this did help them quiet down.  Patient reports that during this assessment the voices have quieted down since he entered the building and are currently "evaluating is talking right now."  Patient reports that he does feel that the family members who the voices belong to could be watching him.  Patient reports that the ultimate goal is to get him off of the family property he believes, prior to the grandfather dying.  Patient reports that he believes they want this so that they can take the property that is allocated to his own father.  Patient reports he is not very much interested in property disputes but is very much aggravated by the voices he is hearing.  Patient reports that they are keeping him from sleeping and make it very hard for him to interact day today.  Patient reports that he does think it is a bit strange that these people are able to communicate with him but the Internet has led to him believing that it is slightly possible.  Patient reports that he does his best to ignore them however at times he can get louder, in certain voices belong to relatives who are more "dangerous" than others including one who has been to prison 2 times.  Patient reports that this specific individual patient does have some relationship with this relative's  mother which is also tenuous.  Patient reports he does not have frequent visual hallucinations but he did have one approximately 4 years ago where he saw Jesus in a crown, and a forming tornado in the sky.  Patient denies any SI and HI.  Patient reports he is not able to fall asleep and describes a very poor sleep pattern of frequent nighttime awakenings at times due to the voices becoming very loud.  Patient reports that he also has delayed ability to fall asleep due to the voices.  Patient endorses that he started to have some anhedonia because the voices  are bothering him so much that he cannot find enjoyment in his activities.  Patient denies feeling of hopelessness/worthlessness or guilt.  Patient reports that his energy has been lower lately and coincides with his decreased sleep.  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  Patient also reports his appetite is stable.  Patient reports that he cannot recall ever having a hypomanic or manic episode.  Provider did speak with patient about an ED visit in 11/2020 where it was documented by the physician concern for manic and erratic behaviors.  Patient reports he does not believe/recall he had any racing thoughts or rapid speech.  Patient reports that he does think he may be had not slept in 2 days and does recall feeling much better after getting 15 minutes of sleep in the ED.  Per records patient did receive Zyprexa during this interaction.  Patient reports he does recall being given something and falling asleep later.  Patient also reports he was not aware he had previously been diagnosed with depression when he is separated from his ex-wife, who is now back in a relationship with.  Patient reports he does not recall having the discussion that was documented per his PCP regarding SSRIs or SNRIs.  Patient does not endorse any history of traumas or screen positive for PTSD.  Patient denies screen positive for GAD or social anxiety.  Associated Signs/Symptoms: Depression Symptoms:  anhedonia, fatigue, loss of energy/fatigue, disturbed sleep, (Hypo) Manic Symptoms:   Not at present Anxiety Symptoms:   Denies Psychotic Symptoms:  Delusions, Hallucinations: Auditory Paranoia, PTSD Symptoms: NA  Past Psychiatric History:  Inpatient: Denies Outpatient: Denies Therapist: Yes at [facility]/therapist concern for schizophrenia Patient has been diagnosed with depression by his PCP in  2019 Patient received Zyprexa 10 mg once, 11/17/2020 in the ED Previous Psychotropic Medications: No beyond above  Substance Abuse History in the last 12 months:  No.  EtOH: Endorses infrequent use over the last 12 months  Consequences of Substance Abuse: Negative  Past Medical History:  Past Medical History:  Diagnosis Date   Anxiety    Chronic back pain    Depression    GERD (gastroesophageal reflux disease)    Substance abuse (HCC)     Past Surgical History:  Procedure Laterality Date   HERNIA REPAIR     LUMBAR LAMINECTOMY/DECOMPRESSION MICRODISCECTOMY N/A 07/04/2014   Procedure: LUMBAR LAMINECTOMY/DECOMPRESSION MICRODISCECTOMY 1 LEVEL,LEFT LUMBAR FIVE-SACRAL ONE;  Surgeon: Coletta Memos, MD;  Location: [facility] OR;  Service: Neurosurgery;  Laterality: N/A;  left   SPINE SURGERY  07/04/2014   Left L5 semihemilaminectomy and L5/S1 discetomy     Family Psychiatric History:  Father: EtOH use disorder Denies any other history of psychiatric diagnoses, suicide attempts, or suicides  Family History:  Family History  Problem Relation Age of Onset   Other Mother    Hernia Father     Social History:   Social History   Socioeconomic History   Marital status: Divorced    Spouse name: Not on file   Number of children: 2   Years of education: Not on file   Highest education level: Not on file  Occupational History   Not on file  Tobacco Use   Smoking status: Never   Smokeless tobacco: Never  Vaping Use   Vaping Use: Never used  Substance and Sexual Activity   Alcohol use: Not Currently    Alcohol/week: 6.0 standard drinks of alcohol    Types: 6 Shots of liquor per week    Comment: last drink 2 days ago. usually drinks 3 days a week.   Drug use: Yes    Types: Marijuana    Comment: last used yesterday   Sexual activity: Not Currently  Other Topics Concern   Not on file  Social History Narrative   Not on file   Social Determinants of Health   Financial Resource  Strain: Not on file  Food Insecurity: Not on file  Transportation Needs: Not on file  Physical Activity: Unknown (02/24/2018)   Exercise Vital Sign    Days of Exercise per Week: 0 days    Minutes of Exercise per Session: Not on file  Stress: Not on file  Social Connections: Not on file    Additional Social History:   -Lives on his grandfather's extensive acreage - Reports that he pays his rent directly to his grandfather - Reports that other people and relatives on the property to the mother (another relative) of one of the more "dangerous" voices in his head -Lives with his ex-wife (in a romantic relationship again) and their 3 kids Kids ages 7.34 years old, 10 years old, and 38 years old - 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 "because I want to be a good role model for my kids and help people" - 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 - Left high school in the 11th grade and went to a special [employer] where he just took a test and was able to get his high school diploma - Reports he was held back in kindergarten - Reports he struggled with pronunciation of words and comprehension as well as writing growing up - Reports he was put in special classes in elementary school Allergies:   Allergies  Allergen Reactions   Ibuprofen Other (See Comments)    Sends pain down spine    Metabolic Disorder Labs: Lab Results  Component Value Date   HGBA1C 5.3 01/10/2022   No results found for: "PROLACTIN" Lab Results  Component Value Date   CHOL 149 01/10/2022   TRIG 195 (H) 01/10/2022   HDL 34 (L) 01/10/2022   CHOLHDL 4.4 01/10/2022   VLDL 37 (H) 08/14/2015   LDLCALC 82 01/10/2022   LDLCALC 59 08/14/2015   Lab Results  Component Value Date   TSH 0.59 01/29/2018    Therapeutic Level Labs: No results found for: "LITHIUM" No results found for: "CBMZ" No results found for: "VALPROATE"  Current Medications: Current Outpatient  Medications  Medication Sig Dispense Refill   QUEtiapine (SEROQUEL) 100 MG tablet Take 0.5 tablets (50 mg total) by mouth at bedtime for 7 days, THEN 1 tablet (100 mg total) at bedtime. 34 tablet 1   amoxicillin-clavulanate (AUGMENTIN) 875-125 MG tablet Take 1 tablet  by mouth 2 (two) times daily. 14 tablet 0   pantoprazole (PROTONIX) 40 MG tablet Take 1 tablet (40 mg total) by mouth daily. 90 tablet 1   promethazine-dextromethorphan (PROMETHAZINE-DM) 6.25-15 MG/5ML syrup Take 5 mLs by mouth 4 (four) times daily as needed for cough. 118 mL 0   No current facility-administered medications for this visit.    Musculoskeletal: Strength & Muscle Tone: within normal limits Gait & Station: normal Patient leans: N/A  Psychiatric Specialty Exam: Review of Systems  Psychiatric/Behavioral:  Positive for hallucinations and sleep disturbance. Negative for suicidal ideas. The patient is not nervous/anxious.     Blood pressure 125/78, pulse 65, resp. rate 12, weight 240 lb (108.9 kg), SpO2 98 %.Body mass index is 30.81 kg/m.  General Appearance: Casual  Eye Contact:  Good  Speech:  Clear and Coherent  Volume:  Normal  Mood:  Euthymic  Affect:  Appropriate  Thought Process:  Coherent  Orientation:  Full (Time, Place, and Person)  Thought Content:  Hallucinations: Auditory  Suicidal Thoughts:  No  Homicidal Thoughts:  No  Memory:  Immediate;   Good Recent;   Fair  Judgement:  Fair  Insight:  Good  Psychomotor Activity:  Normal  Concentration:  Concentration: Good  Recall:  NA  Fund of Knowledge:Good  Language: Good  Akathisia:  No  Handed:    AIMS (if indicated):  not done  Assets:  Communication Skills Desire for Improvement Housing Intimacy Leisure Time Resilience Social Support Transportation  ADL's:  Intact  Cognition: WNL  Sleep:  Poor   Screenings: AUDIT    Flowsheet Row Office Visit from 02/23/2018 in [place]  Alcohol Use Disorder Identification  Test Final Score (AUDIT) 7      GAD-7    Flowsheet Row Office Visit from 11/22/2020 in [place]  Total GAD-7 Score 10      PHQ2-9    [place] Visit from 01/10/2022 in [place] Office Visit from 11/22/2020 in [place] Office Visit from 02/23/2018 in [place] Office Visit from 01/29/2018 in [place] Office Visit from 02/16/2017 in [facility] at [facility] Total Score 0 5 3 0 0  PHQ-9 Total Score -- 12 7 -- --      Flowsheet Row ED from 09/10/2022 in [facility] ED from 11/17/2020 in [facility] Emergency Department at [facility] ED from 05/23/2018 in [facility] Emergency Department at [place] No Risk No Risk No Risk       Assessment and Plan:  Daniel Phelps is a 37 year old patient with a PPH of depression and auditory hallucinations and no prior medication trials.   Based on assessment today, patient does appear to meet criteria for paranoid schizophrenia.  It is a bit interesting that patient's symptoms developed after the death of his grandmother when he was in his mid 69s.  Patient is not able to recall any family history of schizophrenia patient does endorse having neurosurgery, which was documented in the past and does not have any current brain imaging.  It may be worthwhile getting brain imaging to rule out any other possible causes for sudden, chronic psychosis.  While patient does have a history of substance use disorder-patient does not endorse heavy use during his adult years and EMR confirms this.  At this time there is also concern that patient may have schizoaffective disorder, bipolar type as  it has been documented that he may have had depressive episodes or manic episodes in the past that patient himself is not able to confirm these behaviors.  Regardless patient is able to endorse chronic auditory  hallucinations independent of mood symptoms thus he should be on the schizophrenia spectrum.  Patient's poor sleep, anhedonia and decreased energy all appear to be directly linked to worsening auditory hallucinations making it difficult for him to sleep.  Patient is endorsing delusions related to his auditory hallucinations and paranoia related to certain people in his life who are also the owners of the voices he hears in his head. Due to patient's possible mood instability and decreased sleep we will try patient on Seroquel first for sedating effects and mood stabilization.  Patient does appear to have overall good insight and judgment which suggest a better prognosis as well as late age of onset.  Patient has done fairly well without medications thus far.  Schizoaffective disorder bipolar type vs paranoid schizophrenia - Start Seroquel 50 mg x 7 days and increase to 100 mg nightly  Follow-up with patient in approximately 1 month  Collaboration of Care:   Patient/Guardian was advised Release of Information must be obtained prior to any record release in order to collaborate their care with an outside provider. Patient/Guardian was advised if they have not already done so to contact the registration department to sign all necessary forms in order for [place] to release information regarding their care.   Consent: Patient/Guardian gives verbal consent for treatment and assignment of benefits for services provided during this visit. Patient/Guardian expressed understanding and agreed to proceed.    PGY-3 Freida Busman, MD 2/1/202410:27 AM

## 2022-09-16 ENCOUNTER — Ambulatory Visit (INDEPENDENT_AMBULATORY_CARE_PROVIDER_SITE_OTHER): Payer: 59 | Admitting: Student in an Organized Health Care Education/Training Program

## 2022-09-16 ENCOUNTER — Encounter (HOSPITAL_COMMUNITY): Payer: Self-pay | Admitting: Student in an Organized Health Care Education/Training Program

## 2022-09-16 VITALS — BP 143/94 | HR 61 | Wt 241.0 lb

## 2022-09-16 DIAGNOSIS — F200 Paranoid schizophrenia: Secondary | ICD-10-CM | POA: Diagnosis not present

## 2022-09-16 MED ORDER — ARIPIPRAZOLE 5 MG PO TABS: 5.0000 mg | ORAL_TABLET | Freq: Every day | ORAL | 1 refills | Status: DC

## 2022-09-16 NOTE — Progress Notes (Signed)
BH MD/PA/NP OP Progress Note  09/16/2022 10:41 AM Daniel Phelps  MRN:  [id number]  Chief Complaint:  Chief Complaint  Patient presents with   Follow-up   Medication Problem   HPI: Daniel Phelps is a 37 year old patient with a PPH of depression and auditory hallucinations with diagnosis of paranoid schizophrenia.  Patient presents today as a walk-in again endorsing he believes he needs medication adjustment after 4 days trial on Seroquel at 50 mg nightly.  Patient reports that he feels oversedated, his acid reflux has flared up, headaches, and his head feels "like it swelling."  Patient reports that his biggest concern is that it is difficult for him to wake up during the day and he feels as though he is not able to get his work done.  Patient reports that he thinks he would rather not be on an antipsychotic that is sedating as a major side effect, and thinks that he may be able to help himself sleep with just melatonin.  Patient reports that he never got beyond 50 mg and he continued to have auditory hallucinations with no changes.  Patient reports that he is still willing to try medications as he wants to get rid of the auditory hallucinations.  Patient nervously tells provider that the owner of one of his voices was found dead a few days ago.  Patient reports his grandfather gave him the news.  Patient reports he is not 100% sure how this individual died, but he recalls being told "something about fentanyl and alcohol."  Patient reports that this person was the owner of one of the voices that he hears.  Patient reports that this individual was generally more supportive of him and opposed other voices telling him to "leave the property."  Patient reports that he is nervous that the owners of this other voices may have something to do with this individual's death.  Patient reports that the 2 people who found the individual, are aware some of the more nefarious voices he hears.  When provider asked for more  contacts, patient was able to endorse he was aware that all 3 of the individuals normally hang out together and it has not bizarre that they would come across this individual.  He is also aware that all 3 of the individuals partake in illicit substance use.  However, he is worried that if something happens to him "it would be an accident."  Patient asked provider to call him if he does not show up for his appointment in March.  Patient adamantly denies SI and HI but is paranoid.  Patient reports that he actually lives next door to the 2 individuals who he is most concerned about.  Patient reports that he does not talk to them at all, because he believes they are aware of everything going on.  Patient reports he avoids them.  Patient denies HI and VH.  Patient is also able to give a little bit more details about the voices he hears and endorses that while he can identify most of them as people in his real life, there are some voices he does not know their owners.  He also endorses that he hears voices outside of his head and they feel like they are coming out of him from different distances.  He endorses that he will have conversations with the voices at times and that during assessment today, the voices told him "that I am doing a good thing [by talking to you]."  Patient  continues to believe in  V2K. Visit Diagnosis:    ICD-10-CM   1. Paranoid schizophrenia ([place])  F20.0 ARIPiprazole (ABILIFY) 5 MG tablet      Past Psychiatric History:   Inpatient: Denies Outpatient: Denies Therapist: [facility] at [facility]/therapist concern for schizophrenia Patient has been diagnosed with depression by his PCP in 2019 Patient received Zyprexa 10 mg once, 11/17/2020 in the ED  Last visit 09/11/2022-started on Seroquel 50 mg with intention to titrate up to 100 mg due to endorsing AH and wish to fall asleep.  Past Medical History:  Past Medical History:  Diagnosis Date   Anxiety    Chronic back  pain    Depression    GERD (gastroesophageal reflux disease)    Substance abuse ([place])     Past Surgical History:  Procedure Laterality Date   HERNIA REPAIR     LUMBAR LAMINECTOMY/DECOMPRESSION MICRODISCECTOMY N/A 07/04/2014   Procedure: LUMBAR LAMINECTOMY/DECOMPRESSION MICRODISCECTOMY 1 LEVEL,LEFT LUMBAR FIVE-SACRAL ONE;  Surgeon: Ashok Pall, MD;  Location: [facility];  Service: Neurosurgery;  Laterality: N/A;  left   SPINE SURGERY  07/04/2014   Left L5 semihemilaminectomy and L5/S1 discetomy     Family Psychiatric History:   Father: EtOH use disorder Denies any other history of psychiatric diagnoses, suicide attempts, or suicides  Family History:  Family History  Problem Relation Age of Onset   Other Mother    Hernia Father     Social History:  Social History   Socioeconomic History   Marital status: Divorced    Spouse name: Not on file   Number of children: 2   Years of education: Not on file   Highest education level: Not on file  Occupational History   Not on file  Tobacco Use   Smoking status: Never   Smokeless tobacco: Never  Vaping Use   Vaping Use: Never used  Substance and Sexual Activity   Alcohol use: Not Currently    Alcohol/week: 6.0 standard drinks of alcohol    Types: 6 Shots of liquor per week    Comment: last drink 2 days ago. usually drinks 3 days a week.   Drug use: Yes    Types: Marijuana    Comment: last used yesterday   Sexual activity: Not Currently  Other Topics Concern   Not on file  Social History Narrative   Not on file   Social Determinants of Health   Financial Resource Strain: Not on file  Food Insecurity: Not on file  Transportation Needs: Not on file  Physical Activity: Unknown (02/24/2018)   Exercise Vital Sign    Days of Exercise per Week: 0 days    Minutes of Exercise per Session: Not on file  Stress: Not on file  Social Connections: Not on file    Allergies:  Allergies  Allergen Reactions   Ibuprofen Other (See  Comments)    Sends pain down spine    Metabolic Disorder Labs: Lab Results  Component Value Date   HGBA1C 5.3 01/10/2022   No results found for: "PROLACTIN" Lab Results  Component Value Date   CHOL 149 01/10/2022   TRIG 195 (H) 01/10/2022   HDL 34 (L) 01/10/2022   CHOLHDL 4.4 01/10/2022   VLDL 37 (H) 08/14/2015   LDLCALC 82 01/10/2022   LDLCALC 59 08/14/2015   Lab Results  Component Value Date   TSH 0.59 01/29/2018    Therapeutic Level Labs: No results found for: "LITHIUM" No results found for: "VALPROATE" No results found  for: "CBMZ"  Current Medications: Current Outpatient Medications  Medication Sig Dispense Refill   ARIPiprazole (ABILIFY) 5 MG tablet Take 1 tablet (5 mg total) by mouth daily. 30 tablet 1   amoxicillin-clavulanate (AUGMENTIN) 875-125 MG tablet Take 1 tablet by mouth 2 (two) times daily. 14 tablet 0   pantoprazole (PROTONIX) 40 MG tablet Take 1 tablet (40 mg total) by mouth daily. 90 tablet 1   promethazine-dextromethorphan (PROMETHAZINE-DM) 6.25-15 MG/5ML syrup Take 5 mLs by mouth 4 (four) times daily as needed for cough. 118 mL 0   No current facility-administered medications for this visit.     Musculoskeletal: Strength & Muscle Tone: within normal limits Gait & Station: normal Patient leans: N/A  Psychiatric Specialty Exam: Review of Systems  Psychiatric/Behavioral:  Positive for hallucinations. Negative for dysphoric mood, sleep disturbance and suicidal ideas. The patient is nervous/anxious.     Blood pressure (!) 143/94, pulse 61, weight 241 lb (109.3 kg), SpO2 99 %.Body mass index is 30.94 kg/m.  General Appearance: Casual  Eye Contact:  Good  Speech:  Clear and Coherent  Volume:  Normal  Mood:  Anxious  Affect:  Appropriate and Congruent  Thought Process:  Linear  Orientation:  Full (Time, Place, and Person)  Thought Content: Illogical, Delusions, and Hallucinations: Auditory   Suicidal Thoughts:  No  Homicidal Thoughts:  No   Memory:  Immediate;   Good Recent;   Good  Judgement:  Fair  Insight:  Shallow  Psychomotor Activity:  Normal  Concentration:  Concentration: Fair  Recall:  NA  Fund of Knowledge: Good  Language: Good  Akathisia:  No  Handed:    AIMS (if indicated): not done  Assets:  Communication Skills Desire for Improvement Housing Resilience Transportation  ADL's:  Intact  Cognition: WNL  Sleep:  Fair   Screenings: AUDIT    Flowsheet Row Office Visit from 02/23/2018 in [facility]  Alcohol Use Disorder Identification Test Final Score (AUDIT) 7      GAD-7    Flowsheet Row Office Visit from 11/22/2020 in [facility]  Total GAD-7 Score 10      PHQ2-9    Flowsheet Row Office Visit from 01/10/2022 in [facility] Office Visit from 11/22/2020 in [facility] Office Visit from 02/23/2018 in [facility] Office Visit from 01/29/2018 in [facility] Office Visit from 02/16/2017 in [facility] Total Score 0 5 3 0 0  PHQ-9 Total Score -- 12 7 -- --      Flowsheet Row ED from 09/10/2022 in [facility] ED from 11/17/2020 in [facility] Emergency Department at [facility] ED from 05/23/2018 in [facility] Emergency Department at [facility]  C-SSRS RISK CATEGORY No Risk No Risk No Risk        Assessment and Plan:  Daniel Phelps is a 37 year old patient with a PPH of depression and auditory hallucinations with current diagnosis of paranoid schizophrenia.  Based on assessment today, patient's symptoms appear to be fairly consistent with paranoid schizophrenia.  Patient unfortunately was not able to tolerate Seroquel, we will try less sedating antipsychotic, Abilify, will likely have to titrate up.  If patient feels Seroquel may need to reconsider Zyprexa as he has had benefit with this in the past however it was sedating for him, and this is a major  concern for patient at this time.  Patient appeared to have poor insight this visit as  he began to endorse common symptoms of schizophrenia such as belief that there is nothing wrong with him and everybody else may be wrong.  Unfortunately, patient continues to believe that because the concept of V2K is exactly how he perceives the world, there may be nothing that can be done and he might not have schizophrenia but rather some special/altered experience.  Regardless, patient is continuing to try medication as ultimately the auditory hallucinations are bothersome and he can still identify them as auditory hallucinations.  Paranoid schizophrenia - Discontinue Seroquel 50 mg nightly (patient left his Seroquel bottle for provider to dispose of-provider took to downstairs pharmacy) - Start Abilify 5 mg daily - Patient will start melatonin 3 mg nightly  GERD - Recommended patient contact PCP as he has been on Protonix in the past for this  Follow-up in 10/2021  Collaboration of Care: Collaboration of Care:   Patient/Guardian was advised Release of Information must be obtained prior to any record release in order to collaborate their care with an outside provider. Patient/Guardian was advised if they have not already done so to contact the registration department to sign all necessary forms in order for [place] to release information regarding their care.   Consent: Patient/Guardian gives verbal consent for treatment and assignment of benefits for services provided during this visit. Patient/Guardian expressed understanding and agreed to proceed.   PGY-3 Freida Busman, MD 09/16/2022, 10:41 AM

## 2022-09-18 ENCOUNTER — Ambulatory Visit (HOSPITAL_COMMUNITY): Payer: 59 | Admitting: Physician Assistant

## 2022-09-22 ENCOUNTER — Ambulatory Visit: Payer: 59 | Admitting: Family Medicine

## 2022-09-22 DIAGNOSIS — R5383 Other fatigue: Secondary | ICD-10-CM

## 2022-09-22 DIAGNOSIS — K219 Gastro-esophageal reflux disease without esophagitis: Secondary | ICD-10-CM

## 2022-09-22 DIAGNOSIS — R1012 Left upper quadrant pain: Secondary | ICD-10-CM | POA: Diagnosis not present

## 2022-09-22 MED ORDER — OMEPRAZOLE 40 MG PO CPDR: 40.0000 mg | DELAYED_RELEASE_CAPSULE | Freq: Two times a day (BID) | ORAL | 3 refills | Status: DC

## 2022-09-22 NOTE — Progress Notes (Signed)
Subjective:  Patient ID: Daniel Phelps, male    DOB: Aug 17, 1985  Age: 37 y.o. MRN: [id number]  CC: Chief Complaint  Patient presents with   upper quad pain    Bilateral, would like to be tested for his kidneys, heart burn when lays down at night     HPI:  37 year old male with recent diagnosis of schizophrenia presents for evaluation of continued pain.  Patient has a longstanding history of chronic pain.  He localizes it primarily to the left upper quadrant and left lower ribs but also has pain on the right side (lower ribs).  He has previously told me that he has had this for at least a decade.  He has had prior x-ray imaging and prior ultrasound and labs which have been unrevealing.  He has a current referral in place to GI.  Patient reports that his pain continues to persist.  He states that his recent medication has been prescribed has made his symptoms worse.  He also now has some reported difficulty breathing.  He states that he has heartburn particular when he lays down at night.  He has previously been placed on Protonix.  He is not taking it currently.  Patient is also not currently taking the antipsychotic that was prescribed.  Additionally, patient reports that he is having significant fatigue.  He states that he feels better when he works out.  He is concerned that the culprit for his symptoms is related to his kidneys or perhaps "blocked arteries".  Patient Active Problem List   Diagnosis Date Noted   GERD (gastroesophageal reflux disease) 09/22/2022   Schizophrenia spectrum disorder with psychotic disorder type not yet determined ([place]) 09/11/2022   LUQ pain 02/27/2022   Class 1 obesity with body mass index (BMI) of 30.0 to 30.9 in adult 02/24/2018   Chronic back pain 01/29/2018    Social Hx   Social History   Socioeconomic History   Marital status: Divorced    Spouse name: Not on file   Number of children: 2   Years of education: Not on file   Highest education level:  Not on file  Occupational History   Not on file  Tobacco Use   Smoking status: Never   Smokeless tobacco: Never  Vaping Use   Vaping Use: Never used  Substance and Sexual Activity   Alcohol use: Not Currently    Alcohol/week: 6.0 standard drinks of alcohol    Types: 6 Shots of liquor per week    Comment: last drink 2 days ago. usually drinks 3 days a week.   Drug use: Yes    Types: Marijuana    Comment: last used yesterday   Sexual activity: Not Currently  Other Topics Concern   Not on file  Social History Narrative   Not on file   Social Determinants of Health   Financial Resource Strain: Not on file  Food Insecurity: Not on file  Transportation Needs: Not on file  Physical Activity: Unknown (02/24/2018)   Exercise Vital Sign    Days of Exercise per Week: 0 days    Minutes of Exercise per Session: Not on file  Stress: Not on file  Social Connections: Not on file    Review of Systems Per HPI  Objective:  BP 127/88   Pulse 64   Temp (!) 97.5 F (36.4 C)   Ht 6' 2"$  (1.88 m)   Wt 245 lb (111.1 kg)   SpO2 95%   BMI 31.46 kg/m  09/22/2022    8:26 AM 09/16/2022   10:40 AM 09/11/2022   10:25 AM  BP/Weight  Systolic BP [id number]  Diastolic BP 88 94 78  Wt. (Lbs) 245 241 240  BMI 31.46 kg/m2 30.94 kg/m2 30.81 kg/m2    Physical Exam Vitals and nursing note reviewed.  Constitutional:      General: He is not in acute distress.    Appearance: Normal appearance.  HENT:     Head: Normocephalic and atraumatic.  Eyes:     General:        Right eye: No discharge.        Left eye: No discharge.     Conjunctiva/sclera: Conjunctivae normal.  Cardiovascular:     Rate and Rhythm: Normal rate and regular rhythm.  Pulmonary:     Effort: Pulmonary effort is normal.     Breath sounds: Normal breath sounds. No wheezing, rhonchi or rales.  Abdominal:     General: There is no distension.     Palpations: Abdomen is soft.     Tenderness: There is no abdominal  tenderness.  Neurological:     Mental Status: He is alert.  Psychiatric:     Comments: Flat affect.     Lab Results  Component Value Date   WBC 7.7 01/10/2022   HGB 15.6 01/10/2022   HCT 45.5 01/10/2022   PLT 256 01/10/2022   GLUCOSE 84 01/10/2022   CHOL 149 01/10/2022   TRIG 195 (H) 01/10/2022   HDL 34 (L) 01/10/2022   LDLCALC 82 01/10/2022   ALT 46 (H) 01/10/2022   AST 32 01/10/2022   NA 143 01/10/2022   K 4.2 01/10/2022   CL 104 01/10/2022   CREATININE 1.03 01/10/2022   BUN 16 01/10/2022   CO2 23 01/10/2022   TSH 0.59 01/29/2018   HGBA1C 5.3 01/10/2022     Assessment & Plan:   Problem List Items Addressed This Visit       Digestive   GERD (gastroesophageal reflux disease)    Starting on omeprazole.      Relevant Medications   omeprazole (PRILOSEC) 40 MG capsule     Other   LUQ pain    I believe that the patient's symptoms are directly related to his mental health.  I believe that these are somatic complaints.  He has had prior workup which has been unremarkable.  Labs today per patient's request.  Patient given number to call GI.      Relevant Orders   CMP14+EGFR   Other Visit Diagnoses     Fatigue, unspecified type       Relevant Orders   TSH       Meds ordered this encounter  Medications   omeprazole (PRILOSEC) 40 MG capsule    Sig: Take 1 capsule (40 mg total) by mouth 2 (two) times daily before a meal.    Dispense:  60 capsule    Refill:  [facility]

## 2022-09-22 NOTE — Addendum Note (Signed)
Addended by: Anastasio Auerbach R on: 09/22/2022 09:05 AM   Modules accepted: Orders

## 2022-09-22 NOTE — Assessment & Plan Note (Signed)
Starting on omeprazole.

## 2022-09-22 NOTE — Patient Instructions (Addendum)
Call [phone] to schedule GI appt.  Labs today.  Medication as prescribed.  Take care  Dr. Lacinda Axon

## 2022-09-22 NOTE — Assessment & Plan Note (Signed)
I believe that the patient's symptoms are directly related to his mental health.  I believe that these are somatic complaints.  He has had prior workup which has been unremarkable.  Labs today per patient's request.  Patient given number to call GI.

## 2022-09-24 ENCOUNTER — Ambulatory Visit: Payer: 59 | Admitting: Gastroenterology

## 2022-09-24 ENCOUNTER — Encounter: Payer: Self-pay | Admitting: Gastroenterology

## 2022-09-24 VITALS — BP 118/80 | HR 85 | Ht 74.0 in | Wt 240.0 lb

## 2022-09-24 DIAGNOSIS — R12 Heartburn: Secondary | ICD-10-CM | POA: Diagnosis not present

## 2022-09-24 DIAGNOSIS — R0789 Other chest pain: Secondary | ICD-10-CM

## 2022-09-24 NOTE — Patient Instructions (Signed)
_______________________________________________________  If your blood pressure at your visit was 140/90 or greater, please contact your primary care physician to follow up on this.  _______________________________________________________  If you are age 38 or older, your body mass index should be between 23-30. Your Body mass index is 30.81 kg/m. If this is out of the aforementioned range listed, please consider follow up with your Primary Care Provider.  If you are age 20 or younger, your body mass index should be between 19-25. Your Body mass index is 30.81 kg/m. If this is out of the aformentioned range listed, please consider follow up with your Primary Care Provider.   ________________________________________________________  The [facility] providers would like to encourage you to use [facility] to communicate with providers for non-urgent requests or questions.  Due to long hold times on the telephone, sending your provider a message by [facility] may be a faster and more efficient way to get a response.  Please allow 48 business hours for a response.  Please remember that this is for non-urgent requests.  _______________________________________________________  It was a pleasure to see you today!  Thank you for trusting me with your gastrointestinal care!

## 2022-09-24 NOTE — Progress Notes (Addendum)
[name]:  History: Daniel Phelps 09/24/2022  Referring provider: Coral Spikes, DO  Reason for consult/chief complaint: Abdominal Pain (Pt states having sharp dull pain that almost feel like burning sensation under his chest area on his left side.)   Subjective  HPI: Patient was seen by his PCP Dr. Thersa Phelps on 09/22/22 for chronic LUQ and left lower rib pain x10 years. Prior xray, [place], and labs were unrevealing. He reported heartburn, worse when lying down at night. His PCP started him on Omeprazole 85m BID at that time.  He states that he has had left-sided pain underneath anterior upper ribs since he was 37years old. The pain is sharp, stabbing, and intermittent. His pain has recently started worsening in severity and radiating across his upper abdomen to his right side. His pain is exacerbated with certain foods such as fried or fatty foods. His pain was exacerbated after he took 2 doses of Seroquel. The Seroquel was started for auditory hallucinations but did not help with that either. He states that since he tried the Seroquel his pain can be triggered with certain movements and with stepping up onto an elevated surface. He reports that all of his symptoms have improved since switching to a plant-based diet. His pain also improves when he exercises on the elliptical and breathes deeply.  He reports having dry, hard stools. He reports x10 minute episodes of numbness across his upper abdomen if he drinks something sugary. He notes accompanying fatigue. He has been taking Prilosec 491mBID for the last few days with good relief of his heartburn. He reports frequent urination especially if he drinks milk.  He denies any dysphagia, appetite changes, nausea, vomiting, or weight loss. He denies any blood in his stools or anal bleeding.  ROS: Review of Systems  Constitutional:  Positive for fatigue. Negative for appetite change, fever and unexpected weight  change.  HENT:  Negative for trouble swallowing.   Respiratory:  Negative for cough and shortness of breath.   Gastrointestinal:  Positive for abdominal pain. Negative for abdominal distention, anal bleeding, blood in stool, constipation, diarrhea, nausea, rectal pain and vomiting.       + acid reflux + hemorrhoids + dry, hard stools  Genitourinary:  Positive for frequency. Negative for dysuria.  Musculoskeletal:  Negative for back pain.       + left chest wall pain, under upper anterior ribs  Skin:  Negative for rash.  Neurological:  Negative for weakness.  All other systems reviewed and are negative.    Past Medical History: Past Medical History:  Diagnosis Date   Anxiety    Chronic back pain    Depression    GERD (gastroesophageal reflux disease)    Substance abuse ([place]     Past Surgical History: Past Surgical History:  Procedure Laterality Date   HERNIA REPAIR     LUMBAR LAMINECTOMY/DECOMPRESSION MICRODISCECTOMY N/A 07/04/2014   Procedure: LUMBAR LAMINECTOMY/DECOMPRESSION MICRODISCECTOMY 1 LEVEL,LEFT LUMBAR FIVE-SACRAL ONE;  Surgeon: Daniel PallMD;  [facility] Service: Neurosurgery;  Laterality: N/A;  left   SPINE SURGERY  07/04/2014   Left L5 semihemilaminectomy and L5/S1 discetomy      Family History: Family History  Problem Relation Age of Onset   Other Mother    Hernia Father    Diabetes Maternal Grandmother    Diabetes Maternal Grandfather    Liver disease Neg Hx    Esophageal cancer Neg Hx    Colon  cancer Neg Hx     Social History: Social History   Socioeconomic History   Marital status: Single    Spouse name: Not on file   Number of children: 3   Years of education: Not on file   Highest education level: Not on file  Occupational History   Occupation: self employe  Tobacco Use   Smoking status: Never   Smokeless tobacco: Never  Vaping Use   Vaping Use: Never used  Substance and Sexual Activity   Alcohol use: Not Currently     Alcohol/week: 6.0 standard drinks of alcohol    Types: 6 Shots of liquor per week    Comment: last drink 2 days ago. usually drinks 3 days a week.   Drug use: Yes    Types: Marijuana    Comment: last used yesterday   Sexual activity: Not Currently  Other Topics Concern   Not on file  Social History Narrative   Not on file   Social Determinants of Health   Financial Resource Strain: Not on file  Food Insecurity: Not on file  Transportation Needs: Not on file  Physical Activity: Unknown (02/24/2018)   Exercise Vital Sign    Days of Exercise per Week: 0 days    Minutes of Exercise per Session: Not on file  Stress: Not on file  Social Connections: Not on file    Allergies: Allergies  Allergen Reactions   Ibuprofen Other (See Comments)    Sends pain down spine    Outpatient Meds: Current Outpatient Medications  Medication Sig Dispense Refill   omeprazole (PRILOSEC) 40 MG capsule Take 1 capsule (40 mg total) by mouth 2 (two) times daily before a meal. 60 capsule 3   No current facility-administered medications for this visit.      ___________________________________________________________________ Objective   Exam:  BP 118/80   Pulse 85   Ht 6' 2"$  (1.88 m)   Wt 240 lb (108.9 kg)   SpO2 98%   BMI 30.81 kg/m  Wt Readings from Last 3 Encounters:  09/24/22 240 lb (108.9 kg)  09/22/22 245 lb (111.1 kg)  08/12/22 240 lb 3.2 oz (109 kg)    General: well-appearing, restricted affect Eyes: sclera anicteric, no redness ENT: oral mucosa moist without lesions, no cervical or supraclavicular lymphadenopathy CV: RRR, no JVD, no peripheral edema Resp: clear to auscultation bilaterally, normal RR and effort noted GI: soft, no tenderness, with active bowel sounds. No guarding or palpable organomegaly noted. Skin; warm and dry, no rash or jaundice noted Neuro: awake, alert and oriented x 3. Normal gross motor function and fluent speech  No erythema, visible or palpable  abnormalities of the chest wall in the area where he indicates the tenderness.  The area of pain is located over the left anterior lateral chest wall, well above the costal margin.  There is a second area on the right anterior chest wall, also visibly normal with no palpable abnormalities.  He says I will not be able to notice anything because the pain is deep. Labs:     Latest Ref Rng & Units 01/10/2022    3:08 PM 11/17/2020   10:49 AM 05/23/2018    9:08 AM  CBC  WBC 3.4 - 10.8 x10E3/uL 7.7  9.3  7.7   Hemoglobin 13.0 - 17.7 g/dL 15.6  14.8  13.7   Hematocrit 37.5 - 51.0 % 45.5  43.7  40.9   Platelets 150 - 450 x10E3/uL 256  236  225  Latest Ref Rng & Units 01/10/2022    3:08 PM 11/17/2020   10:49 AM 05/23/2018    9:08 AM  CMP  Glucose 70 - 99 mg/dL 84  118  120   BUN 6 - 20 mg/dL 16  9  14   $ Creatinine 0.76 - 1.27 mg/dL 1.03  0.96  0.84   Sodium 134 - 144 mmol/L 143  137  137   Potassium 3.5 - 5.2 mmol/L 4.2  3.3  3.5   Chloride 96 - 106 mmol/L 104  102  101   CO2 20 - 29 mmol/L 23  24  28   $ Calcium 8.7 - 10.2 mg/dL 9.8  9.0  9.3   Total Protein 6.0 - 8.5 g/dL 7.4  7.5  7.4   Total Bilirubin 0.0 - 1.2 mg/dL 0.3  0.6  0.8   Alkaline Phos 44 - 121 IU/L 79  57  50   AST 0 - 40 IU/L 32  49  21   ALT 0 - 44 IU/L 46  72  28    Negative H. pylori IgG antibody in 2011.  Radiologic Studies:  US abdomen complete 01/31/22 FINDINGS: Gallbladder: No gallstones or wall thickening visualized. No sonographic Murphy sign noted by sonographer. Common bile duct: Diameter: 2 mm Liver: No focal lesion identified. Within normal limits in parenchymal echogenicity. Portal vein is patent on color Doppler imaging with normal direction of blood flow towards the liver. IVC: No abnormality visualized. Pancreas: Visualized portion unremarkable. Spleen: Size and appearance within normal limits. Right Kidney: Length: 11.7 cm. Echogenicity within normal limits. No mass or hydronephrosis  visualized. Left Kidney: Length: 11.3 cm. Echogenicity within normal limits. No mass or hydronephrosis visualized. Abdominal aorta: No aneurysm visualized. Other findings: None. IMPRESSION: Unremarkable examination.  Chest XR 2 view 01/10/2022: FINDINGS: Normal heart size, mediastinal contours, and pulmonary vascularity. Lungs clear. No pleural effusion or pneumothorax. Bones unremarkable. IMPRESSION: Normal exam.   Assessment: Chest wall pain  Heartburn    This patient has musculoskeletal chest wall pain with concomitant heartburn.  I do not suspect his pain is from an upper digestive source.  He asked whether some kind of scan might be able to look for other problems, and I will leave that the discretion of his primary care provider since this thoracic pain is not believed to be digestive in nature.  This pain has been present for 17 years, and a somatizations disorder should be considered. He was concerned he may have ulcer or some kind of cancer.  He has heartburn that was recently improved after starting PPI.   I offered him an upper endoscopy to completely rule out any visible problems, though my pretest suspicion is that it would likely be normal.  He was disinclined to do that today, and says he will let us know if he changes his mind.   Plan: Follow up prn   Thank you for the courtesy of this consult.  Please call me with any questions or concerns.   Wilfrid Lund, MD    Velora Heckler Lanette Hampshire, [place], MD, have reviewed all documentation for this visit. The documentation on 09/24/22 for the exam, diagnosis, procedures, and orders are all accurate and complete.   CC: Referring provider noted above   I,Alexis Herring,acting as a scribe for [place], MD.,have documented all relevant documentation on the behalf of Doran Stabler, MD,as directed by  Doran Stabler, MD while in  the presence of Doran Stabler, MD.

## 2022-10-13 DIAGNOSIS — R1012 Left upper quadrant pain: Secondary | ICD-10-CM | POA: Diagnosis not present

## 2022-10-13 DIAGNOSIS — R5383 Other fatigue: Secondary | ICD-10-CM | POA: Diagnosis not present

## 2022-10-14 ENCOUNTER — Ambulatory Visit: Payer: 59 | Admitting: Family Medicine

## 2022-10-14 LAB — CMP14+EGFR
ALT: 40 IU/L (ref 0–44)
AST: 22 IU/L (ref 0–40)
Albumin/Globulin Ratio: 1.9 (ref 1.2–2.2)
Albumin: 4.8 g/dL (ref 4.1–5.1)
Alkaline Phosphatase: 67 IU/L (ref 44–121)
BUN/Creatinine Ratio: 22 — ABNORMAL HIGH (ref 9–20)
BUN: 22 mg/dL — ABNORMAL HIGH (ref 6–20)
Bilirubin Total: 0.4 mg/dL (ref 0.0–1.2)
CO2: 23 mmol/L (ref 20–29)
Calcium: 9.7 mg/dL (ref 8.7–10.2)
Chloride: 103 mmol/L (ref 96–106)
Creatinine, Ser: 1.02 mg/dL (ref 0.76–1.27)
Globulin, Total: 2.5 g/dL (ref 1.5–4.5)
Glucose: 124 mg/dL — ABNORMAL HIGH (ref 70–99)
Potassium: 4.3 mmol/L (ref 3.5–5.2)
Sodium: 140 mmol/L (ref 134–144)
Total Protein: 7.3 g/dL (ref 6.0–8.5)
eGFR: 97 mL/min/{1.73_m2} (ref >59)

## 2022-10-14 LAB — TSH: TSH: 0.899 u[IU]/mL (ref 0.450–4.500)

## 2022-10-15 ENCOUNTER — Ambulatory Visit (HOSPITAL_COMMUNITY): Payer: 59 | Admitting: Student in an Organized Health Care Education/Training Program

## 2022-10-15 ENCOUNTER — Encounter (HOSPITAL_COMMUNITY): Payer: Self-pay

## 2022-10-21 ENCOUNTER — Ambulatory Visit: Payer: 59 | Admitting: Family Medicine

## 2022-10-21 VITALS — BP 122/84 | HR 96 | Ht 74.0 in | Wt 239.4 lb

## 2022-10-21 DIAGNOSIS — K219 Gastro-esophageal reflux disease without esophagitis: Secondary | ICD-10-CM

## 2022-10-21 DIAGNOSIS — R0789 Other chest pain: Secondary | ICD-10-CM | POA: Diagnosis not present

## 2022-10-21 DIAGNOSIS — G8929 Other chronic pain: Secondary | ICD-10-CM

## 2022-10-21 HISTORY — DX: Other chronic pain: G89.29

## 2022-10-21 MED ORDER — LEVOCETIRIZINE DIHYDROCHLORIDE 5 MG PO TABS: 5.0000 mg | ORAL_TABLET | Freq: Every evening | ORAL | 0 refills | Status: DC

## 2022-10-21 MED ORDER — OMEPRAZOLE 40 MG PO CPDR: 40.0000 mg | DELAYED_RELEASE_CAPSULE | Freq: Two times a day (BID) | ORAL | 3 refills | Status: DC

## 2022-10-21 NOTE — Patient Instructions (Signed)
I put the order in. We will work on Biochemist, clinical and scheduling the MRI.  I have refilled the omeprazole.  Take care  Dr. Lacinda Axon

## 2022-10-21 NOTE — Assessment & Plan Note (Signed)
I feel that this is somatization in the setting of uncontrolled schizophrenia.  Patient does not agree.  He feels that there is an organic cause.  I reached out to psych and hopefully he can be re-evaluated soon. MRI chest to assess for underlying cause. Low suspicion given chronicity an negative prior work up.

## 2022-10-21 NOTE — Progress Notes (Signed)
Subjective:  Patient ID: Daniel Phelps, male    DOB: 1985/12/18  Age: 37 y.o. MRN: [id number]  CC: Chief Complaint  Patient presents with   Follow-up    From GI specialist   Sore Throat    Started this am    HPI:  37 year old male presents for evaluation of chronic chest wall/rib pain.   This a chronic issue for the patient. Has had chest xray, abdominal US, labs, and recent evaluation by GI. Work up has been negative. Patient presents today for re-evaluation and to discuss additional imaging.  He describes the pain as sharp and burning. Located on the left lower ribs and LUQ as well as right lower ribs and RUQ.   Patient reports that he had a sore throat this am. It is "itchy".  Patient recently diagnosed with schizophrenia. He is not currently taking medication for this. Previous medication worsened his pain and "caused my organs to shut down".   Patient Active Problem List   Diagnosis Date Noted   Chronic chest wall pain 10/21/2022   GERD (gastroesophageal reflux disease) 09/22/2022   Schizophrenia spectrum disorder with psychotic disorder type not yet determined ([place]) 09/11/2022   Class 1 obesity with body mass index (BMI) of 30.0 to 30.9 in adult 02/24/2018   Chronic back pain 01/29/2018    Social Hx   Social History   Socioeconomic History   Marital status: Single    Spouse name: Not on file   Number of children: 3   Years of education: Not on file   Highest education level: Not on file  Occupational History   Occupation: self employe  Tobacco Use   Smoking status: Never   Smokeless tobacco: Never  Vaping Use   Vaping Use: Never used  Substance and Sexual Activity   Alcohol use: Not Currently    Alcohol/week: 6.0 standard drinks of alcohol    Types: 6 Shots of liquor per week    Comment: last drink 2 days ago. usually drinks 3 days a week.   Drug use: Yes    Types: Marijuana    Comment: last used yesterday   Sexual activity: Not Currently  Other Topics  Concern   Not on file  Social History Narrative   Not on file   Social Determinants of Health   Financial Resource Strain: Not on file  Food Insecurity: Not on file  Transportation Needs: Not on file  Physical Activity: Unknown (02/24/2018)   Exercise Vital Sign    Days of Exercise per Week: 0 days    Minutes of Exercise per Session: Not on file  Stress: Not on file  Social Connections: Not on file    Review of Systems Per HPI  Objective:  BP 122/84   Pulse 96   Ht '6\' 2"'$  (1.88 m)   Wt 239 lb 6.4 oz (108.6 kg)   BMI 30.74 kg/m      10/21/2022    2:58 PM 09/24/2022    3:01 PM 09/22/2022    8:26 AM  BP/Weight  Systolic BP [id number]  Diastolic BP 84 80 88  Wt. (Lbs) 239.4 240 245  BMI 30.74 kg/m2 30.81 kg/m2 31.46 kg/m2    Physical Exam Constitutional:      General: He is not in acute distress.    Appearance: Normal appearance.  HENT:     Head: Normocephalic and atraumatic.     Mouth/Throat:     Pharynx: Oropharynx is clear.  Cardiovascular:  Rate and Rhythm: Normal rate and regular rhythm.  Pulmonary:     Effort: Pulmonary effort is normal.     Breath sounds: Normal breath sounds. No wheezing, rhonchi or rales.  Chest:     Chest wall: No tenderness.  Abdominal:     General: There is no distension.     Palpations: Abdomen is soft.     Tenderness: There is no abdominal tenderness.  Neurological:     Mental Status: He is alert.  Psychiatric:     Comments: Flat affect.      Lab Results  Component Value Date   WBC 7.7 01/10/2022   HGB 15.6 01/10/2022   HCT 45.5 01/10/2022   PLT 256 01/10/2022   GLUCOSE 124 (H) 10/13/2022   CHOL 149 01/10/2022   TRIG 195 (H) 01/10/2022   HDL 34 (L) 01/10/2022   LDLCALC 82 01/10/2022   ALT 40 10/13/2022   AST 22 10/13/2022   NA 140 10/13/2022   K 4.3 10/13/2022   CL 103 10/13/2022   CREATININE 1.02 10/13/2022   BUN 22 (H) 10/13/2022   CO2 23 10/13/2022   TSH 0.899 10/13/2022   HGBA1C 5.3 01/10/2022      Assessment & Plan:   Problem List Items Addressed This Visit       Digestive   GERD (gastroesophageal reflux disease)   Relevant Medications   omeprazole (PRILOSEC) 40 MG capsule     Other   Chronic chest wall pain - Primary    I feel that this is somatization in the setting of uncontrolled schizophrenia.  Patient does not agree.  He feels that there is an organic cause.  I reached out to psych and hopefully he can be re-evaluated soon. MRI chest to assess for underlying cause. Low suspicion given chronicity an negative prior work up.      Relevant Orders   MR CHEST W WO CONTRAST    Meds ordered this encounter  Medications   omeprazole (PRILOSEC) 40 MG capsule    Sig: Take 1 capsule (40 mg total) by mouth 2 (two) times daily before a meal.    Dispense:  60 capsule    Refill:  3   levocetirizine (XYZAL) 5 MG tablet    Sig: Take 1 tablet (5 mg total) by mouth every evening.    Dispense:  30 tablet    Refill:  0    Follow-up: Pending MRI  [place]

## 2022-11-03 ENCOUNTER — Ambulatory Visit: Payer: 59 | Admitting: Family Medicine

## 2022-11-03 VITALS — BP 120/88 | HR 60 | Temp 98.1°F | Ht 74.0 in | Wt 237.0 lb

## 2022-11-03 DIAGNOSIS — R41840 Attention and concentration deficit: Secondary | ICD-10-CM | POA: Insufficient documentation

## 2022-11-03 NOTE — Progress Notes (Signed)
Subjective:  Patient ID: Daniel Phelps, male    DOB: [birth year]  Age: 37 y.o. MRN: [id number]  CC: Chief Complaint  Patient presents with   focus problems    HPI:  37 year old male presents for evaluation of the above.  Patient reports that he has difficulty focusing.  He states that he often feels "foggy".  He was previously treated with Adderall in 2019.  Patient states that he would like to restart this medication, preferably at a higher dose.  Patient is no longer seeing psychiatry/behavioral health.  Patient Active Problem List   Diagnosis Date Noted   Difficulty concentrating 11/03/2022   Chronic chest wall pain 10/21/2022   GERD (gastroesophageal reflux disease) 09/22/2022   Schizophrenia spectrum disorder with psychotic disorder type not yet determined ([place]) 09/11/2022   Class 1 obesity with body mass index (BMI) of 30.0 to 30.9 in adult 02/24/2018   Chronic back pain 01/29/2018    Social Hx   Social History   Socioeconomic History   Marital status: Single    Spouse name: Not on file   Number of children: 3   Years of education: Not on file   Highest education level: Not on file  Occupational History   Occupation: self employe  Tobacco Use   Smoking status: Never   Smokeless tobacco: Never  Vaping Use   Vaping Use: Never used  Substance and Sexual Activity   Alcohol use: Not Currently    Alcohol/week: 6.0 standard drinks of alcohol    Types: 6 Shots of liquor per week    Comment: last drink 2 days ago. usually drinks 3 days a week.   Drug use: Yes    Types: Marijuana    Comment: last used yesterday   Sexual activity: Not Currently  Other Topics Concern   Not on file  Social History Narrative   Not on file   Social Determinants of Health   Financial Resource Strain: Not on file  Food Insecurity: Not on file  Transportation Needs: Not on file  Physical Activity: Unknown (02/24/2018)   Exercise Vital Sign    Days of Exercise per Week: 0 days    Minutes  of Exercise per Session: Not on file  Stress: Not on file  Social Connections: Not on file    Review of Systems Per HPI  Objective:  BP 120/88   Pulse 60   Temp 98.1 F (36.7 C)   Ht 6\' 2"  (1.88 m)   Wt 237 lb (107.5 kg)   SpO2 97%   BMI 30.43 kg/m      11/03/2022   10:18 AM 10/21/2022    2:58 PM 09/24/2022    3:01 PM  BP/Weight  Systolic BP 123456 [id number] [id number]  Diastolic BP 88 84 80  Wt. (Lbs) 237 239.4 240  BMI 30.43 kg/m2 30.74 kg/m2 30.81 kg/m2    Physical Exam Vitals and nursing note reviewed.  Constitutional:      General: He is not in acute distress.    Appearance: Normal appearance.  HENT:     Head: Normocephalic and atraumatic.  Cardiovascular:     Rate and Rhythm: Normal rate and regular rhythm.  Pulmonary:     Effort: Pulmonary effort is normal.     Breath sounds: Normal breath sounds. No wheezing or rales.  Neurological:     Mental Status: He is alert.  Psychiatric:     Comments: Flat affect.     Lab Results  Component Value Date  WBC 7.7 01/10/2022   HGB 15.6 01/10/2022   HCT 45.5 01/10/2022   PLT 256 01/10/2022   GLUCOSE 124 (H) 10/13/2022   CHOL 149 01/10/2022   TRIG 195 (H) 01/10/2022   HDL 34 (L) 01/10/2022   LDLCALC 82 01/10/2022   ALT 40 10/13/2022   AST 22 10/13/2022   NA 140 10/13/2022   K 4.3 10/13/2022   CL 103 10/13/2022   CREATININE 1.02 10/13/2022   BUN 22 (H) 10/13/2022   CO2 23 10/13/2022   TSH 0.899 10/13/2022   HGBA1C 5.3 01/10/2022     Assessment & Plan:   Problem List Items Addressed This Visit       Other   Difficulty concentrating - Primary    Patient endorsing trouble focusing/concentrating. Given concerns regarding his mental health, I do not recommend starting stimulant medication. I advised the patient that I would be happy to refer him back to psychiatry.  Patient declined.       [place]

## 2022-11-03 NOTE — Assessment & Plan Note (Signed)
Patient endorsing trouble focusing/concentrating. Given concerns regarding his mental health, I do not recommend starting stimulant medication. I advised the patient that I would be happy to refer him back to psychiatry.  Patient declined.

## 2022-11-03 NOTE — Patient Instructions (Signed)
Please consider seeing psychiatry regarding your symptoms.  I do not recommend Adderall.  You can call the hospital about the cost/pricing of your MRI out of pocket.  Take care  Dr. Lacinda Axon

## 2022-11-05 ENCOUNTER — Encounter (HOSPITAL_COMMUNITY): Payer: Self-pay | Admitting: Student in an Organized Health Care Education/Training Program

## 2022-11-05 ENCOUNTER — Ambulatory Visit (INDEPENDENT_AMBULATORY_CARE_PROVIDER_SITE_OTHER): Payer: 59 | Admitting: Student in an Organized Health Care Education/Training Program

## 2022-11-05 VITALS — BP 153/80 | HR 71 | Resp 12 | Wt 238.0 lb

## 2022-11-05 DIAGNOSIS — F29 Unspecified psychosis not due to a substance or known physiological condition: Secondary | ICD-10-CM | POA: Diagnosis not present

## 2022-11-05 NOTE — Progress Notes (Signed)
BH MD/PA/NP OP Progress Note  11/05/2022 12:54 PM Daniel Phelps  MRN:  [id number]  Chief Complaint:  Chief Complaint  Patient presents with   Follow-up   HPI: Daniel Phelps is a 37 year old patient with a PPH of depression and auditory hallucinations with diagnosis of paranoid schizophrenia.  Patient presents again today as a walk-in after canceling his scheduled follow-up with this provider.  Patient endorses that he has not been taking previously prescribed Abilify after throwing away on day 3.  Patient reports that he is "doing better ", he reports that the voices went away. Patient reports that when he started to talking to the people he heard, in person the voices started to go away. Patient reports that he no longer worries about people wanting him off the property or dead. He reports that there was no specific trigger that led to him talking to his neighbors other than being really annoyed with the voices. He does agree that he was a bit paranoid and that by reaching out this helped decrease his paranoia.  Patient reports that the voices have been gone for 1 month. Patient reports that he is here to get back on Adderall. Patient reports that he is not taking Abilify and he threw it in the trash, he reports that he take like 2 of them.   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 Patient reports that he is struggling with comprehension of what he is reading. Patient denies having headaches. Patient reports that he did not have these issues when he was in high school. Patient reports that he is spending longer than average time reading questions and comprehending what it being asked. Patient reports that this has been going of for about 1 year. Patient reports that he can get brain fog when he is trying to concentrate.   Patient denies TBI hx.   Patient reports that he is sleeping well and eating well. Patient denies SI,  HI, and AVH. Patient reports that his mood has been good. Patient denies feeling nervous or on edge. Patient reports that the chest pain comes and goes.  Patient does endorse that he can see that his chest pain may correlate with stress. He is able to recall an exact episode. Patient reports that he feels like he has a lot of responsibility and this also makes him concerned that he can't concentrate. Patient reports that watching the 3 kids can be overwhelming. Patient endorses that he is also responsible for paying the bills. Patient endorses that he is religious and this really helps him "be strong."   Patient reports that he is also recognizing that talking to others helps his mental health.   Visit Diagnosis:    ICD-10-CM   1. Schizophrenia spectrum disorder with psychotic disorder type not yet determined [facility])  [facility] referral to Psychology    [facility] [place]      Past Psychiatric History:  Inpatient: Denies Outpatient: Denies Therapist: [facility]therapist concern for schizophrenia Patient has been diagnosed with depression by his PCP in 2019 Patient received Zyprexa 10 mg once, 11/17/2020 in the ED   Last visit 09/11/2022-started on Seroquel 50 mg with intention to titrate up to 100 mg due to endorsing AH and wish to fall asleep.  09/16/2022 -patient endorsed that Seroquel was oversedating.  Patient started on Abilify at 5 mg. PCP messaged  a few days later endorsing patient quit medication. PCP messaged a few weeks later endorsing concern for patient and that they needed to be seen feeling that his mental health was contributing to patient's medical encounters.  Past Medical History:  Past Medical History:  Diagnosis Date   Anxiety    Chronic back pain    Depression    GERD (gastroesophageal reflux disease)    Substance abuse ([place])     Past Surgical History:  Procedure Laterality Date   HERNIA REPAIR     LUMBAR LAMINECTOMY/DECOMPRESSION  MICRODISCECTOMY N/A 07/04/2014   Procedure: LUMBAR LAMINECTOMY/DECOMPRESSION MICRODISCECTOMY 1 LEVEL,LEFT LUMBAR FIVE-SACRAL ONE;  Surgeon: Ashok Pall, MD;  Location: [place];  Service: Neurosurgery;  Laterality: N/A;  left   SPINE SURGERY  07/04/2014   Left L5 semihemilaminectomy and L5/S1 discetomy     Family Psychiatric History: Father: EtOH use disorder Denies any other history of psychiatric diagnoses, suicide attempts, or suicides  Family History:  Family History  Problem Relation Age of Onset   Other Mother    Hernia Father    Diabetes Maternal Grandmother    Diabetes Maternal Grandfather    Liver disease Neg Hx    Esophageal cancer Neg Hx    Colon cancer Neg Hx     Social History:  Social History   Socioeconomic History   Marital status: Single    Spouse name: Not on file   Number of children: 3   Years of education: Not on file   Highest education level: Not on file  Occupational History   Occupation: self employe  Tobacco Use   Smoking status: Never   Smokeless tobacco: Never  Vaping Use   Vaping Use: Never used  Substance and Sexual Activity   Alcohol use: Not Currently    Alcohol/week: 6.0 standard drinks of alcohol    Types: 6 Shots of liquor per week    Comment: last drink 2 days ago. usually drinks 3 days a week.   Drug use: Yes    Types: Marijuana    Comment: last used yesterday   Sexual activity: Not Currently  Other Topics Concern   Not on file  Social History Narrative   Not on file   Social Determinants of Health   Financial Resource Strain: Not on file  Food Insecurity: Not on file  Transportation Needs: Not on file  Physical Activity: Unknown (02/24/2018)   Exercise Vital Sign    Days of Exercise per Week: 0 days    Minutes of Exercise per Session: Not on file  Stress: Not on file  Social Connections: Not on file    Allergies:  Allergies  Allergen Reactions   Ibuprofen Other (See Comments)    Sends pain down spine     Metabolic Disorder Labs: Lab Results  Component Value Date   HGBA1C 5.3 01/10/2022   No results found for: "PROLACTIN" Lab Results  Component Value Date   CHOL 149 01/10/2022   TRIG 195 (H) 01/10/2022   HDL 34 (L) 01/10/2022   CHOLHDL 4.4 01/10/2022   VLDL 37 (H) 08/14/2015   LDLCALC 82 01/10/2022   LDLCALC 59 08/14/2015   Lab Results  Component Value Date   TSH 0.899 10/13/2022   TSH 0.59 01/29/2018    Therapeutic Level Labs: No results found for: "LITHIUM" No results found for: "VALPROATE" No results found for: "CBMZ"  Current Medications: Current Outpatient Medications  Medication Sig Dispense Refill   levocetirizine (XYZAL) 5 MG tablet Take 1 tablet (5  mg total) by mouth every evening. 30 tablet 0   omeprazole (PRILOSEC) 40 MG capsule Take 1 capsule (40 mg total) by mouth 2 (two) times daily before a meal. 60 capsule 3   No current facility-administered medications for this visit.     Musculoskeletal: Strength & Muscle Tone: within normal limits Gait & Station: normal Patient leans: N/A  Psychiatric Specialty Exam: Review of Systems  Psychiatric/Behavioral:  Negative for dysphoric mood, hallucinations, sleep disturbance and suicidal ideas. The patient is nervous/anxious.     Blood pressure (!) 153/80, pulse 71, resp. rate 12, weight 238 lb (108 kg), SpO2 98 %.Body mass index is 30.56 kg/m.  General Appearance: Casual wearing the outfit he has worn on at least 1 previous encounter  Eye Contact:  Good  Speech:  Clear and Coherent  Volume:  Normal  Mood:  Euthymic  Affect:  Guarded  Thought Process:  Goal Directed  Orientation:  Full (Time, Place, and Person)  Thought Content: Perseverates, do believe patient is preoccupied with his poor focus to an abnormal degree also the way that patient speaks about his religious beliefs does not appear very normal, again leads toward somewhat of an obsessive or "all-or nothing" like view.   Suicidal Thoughts:  No   Homicidal Thoughts:  No  Memory:  Immediate;   Fair Recent;   Fair  Judgement:  Impaired  Insight:  Shallow  Psychomotor Activity:  Normal  Concentration:  Concentration: Fair  Recall:  NA  Fund of Knowledge: Fair  Language: Good  Akathisia:  NA  Handed:    AIMS (if indicated): not done  Assets:  Desire for Improvement Housing Resilience Transportation  ADL's:  Intact  Cognition: WNL  Sleep:  Good   Screenings: AUDIT    Flowsheet Row Office Visit from 02/23/2018 in [place]  Alcohol Use Disorder Identification Test Final Score (AUDIT) 7      GAD-7    Flowsheet Row Office Visit from 11/03/2022 in [place] Office Visit from 10/21/2022 in [place] Office Visit from 09/22/2022 in [place] Office Visit from 11/22/2020 in [place]  Total GAD-7 Score 1 3 0 10      PHQ2-9    [place] Office Visit from 11/03/2022 in [place] Office Visit from 10/21/2022 in [place] Office Visit from 09/22/2022 in [place] Visit from 01/10/2022 in [place] Office Visit from 11/22/2020 in [place]  PHQ-2 Total Score 0 0 0 0 5  PHQ-9 Total Score 5 6 0 -- 12      [place] ED from 09/10/2022 in [facility] ED from 11/17/2020 in [facility] Emergency Department at [facility] ED from 05/23/2018 in [facility] Emergency Department at [place] No Risk No Risk No Risk        Assessment and Plan:  Daniel Phelps is a 37 year old patient with a PPH of depression and auditory hallucinations with diagnosis of paranoid schizophrenia.  Patient endorses that his auditory hallucinations have suddenly stopped after 3 years.  Patient does endorse implementing some CBTp methods, without  requiring external therapeutic intervention.  He appears to have tested his paranoia and was able to break it by doing so.  Patient's delusions about V2K appear to be less strong.  Patient also appears to be less paranoid about  being in harm's way.  Regardless patient's presentation over the last 3 visits is a bit bizarre.  He is endorsing onset of psychosis that was persistent without substance use, at age but this is not commonly seen.  He is now endorsing sudden resolution of symptoms and everything is "okay."  Patient also appears to suffer from anxiety and poor insight into this and managing his anxiety.  He would benefit from therapy however he is a bit resistant to this and is relying more on his religious beliefs, but using them more as an obstacle to getting help.  Some insight improvement secondary to testing his paranoia, as he recognizes that talking others may be helpful for his own mental health, but again he is overall still fairly resistant to this idea.  I do think it is in patient's best interest that he get a head MRI as he never had one for first break psychosis at age older than normally seen.  Patient is also endorsing changes in his concentration and brain fog.  Patient was not willing to get CT as he was nervous about radiation however, MRI will be more useful.  Patient currently appears he preoccupied with the thought of him having ADHD, previous notes can be seen by patient's PCP endorsing they will not be starting patient on a stimulant as I do not think it is in patient's best interest.  This provider agrees.  It is also interesting that patient is endorsing that he does not have a history of poor concentration until the last few months.  He does endorse he has been on Adderall in the past, but his request does not seem appropriate.  Chart review also documents that Adderall previously made him feel irritated and more animated, which decreases concern the patient truly has ADHD.   Regardless the symptoms and timeline he is reporting are bit concerning. Do believe that patient is a bit guarded on assessment.   Schizophrenia, paranoid type - Patient not willing to take medication, endorses being stable at this time - MRI brain referral sent to [place] to PCP patient's encounter and possible work up for [employer] or Hemochormatosis  Patient concern for ADHD - Referral sent to [facility] for ADHD testing  Collaboration of Care: Collaboration of Care:   Patient/Guardian was advised Release of Information must be obtained prior to any record release in order to collaborate their care with an outside provider. Patient/Guardian was advised if they have not already done so to contact the registration department to sign all necessary forms in order for [place] to release information regarding their care.   Consent: Patient/Guardian gives verbal consent for treatment and assignment of benefits for services provided during this visit. Patient/Guardian expressed understanding and agreed to proceed.   PGY-3 Freida Busman, MD 11/05/2022, 12:54 PM

## 2022-11-13 ENCOUNTER — Encounter: Payer: Self-pay | Admitting: *Deleted

## 2022-11-13 ENCOUNTER — Other Ambulatory Visit: Payer: Self-pay | Admitting: Family Medicine

## 2022-11-20 ENCOUNTER — Ambulatory Visit (HOSPITAL_COMMUNITY): Payer: 59

## 2023-05-01 ENCOUNTER — Ambulatory Visit (INDEPENDENT_AMBULATORY_CARE_PROVIDER_SITE_OTHER): Payer: Self-pay | Admitting: Psychology

## 2023-05-01 DIAGNOSIS — F29 Unspecified psychosis not due to a substance or known physiological condition: Secondary | ICD-10-CM

## 2023-05-01 NOTE — Progress Notes (Unsigned)
                Rudra Hobbins, PhD 

## 2023-05-04 ENCOUNTER — Ambulatory Visit: Payer: 59 | Admitting: Psychology

## 2023-05-13 ENCOUNTER — Ambulatory Visit: Payer: 59 | Admitting: Psychology

## 2023-05-18 ENCOUNTER — Ambulatory Visit: Payer: 59 | Admitting: Psychology

## 2023-06-01 ENCOUNTER — Ambulatory Visit: Payer: 59 | Admitting: Psychology

## 2023-07-07 ENCOUNTER — Telehealth: Payer: Self-pay | Admitting: *Deleted

## 2023-07-07 ENCOUNTER — Telehealth: Payer: Self-pay

## 2023-07-07 NOTE — Telephone Encounter (Unsigned)
Copied from CRM [id number]. Topic: Clinical - Request for Lab/Test Order >> Jul 07, 2023  4:12 PM Amy B wrote: Reason for CRM: Patient requests the order for MRI BRAIN to be faxed to [facility] in [place], [place].

## 2023-07-07 NOTE — Telephone Encounter (Signed)
Tommie Sams, DO     MRI was not approved by his insurance company.

## 2023-07-07 NOTE — Telephone Encounter (Signed)
Patient states at his last visit, he was told to have an MRI of his chest; however, there is no order currently in Epic.  Patient requests a call back to discuss, [phone].   Please advise

## 2023-07-07 NOTE — Telephone Encounter (Unsigned)
Copied from CRM [id number]. Topic: Clinical - Request for Lab/Test Order >> Jul 07, 2023  4:24 PM Amy B wrote: Reason for CRM:  Patient states at his last visit, he was told to have an MRI of his chest; however, there is no order currently in Epic.  Patient requests a call back to discuss, [phone].

## 2023-07-08 NOTE — Telephone Encounter (Signed)
Called pt to inform that MRI was not approved by insurance back in march, pt states he is willing to pay out of pocket if this can be reordered. I informed pt he would need to be seen but pt stated dr Adriana Simas told him he would reorder, Please advise

## 2023-07-17 ENCOUNTER — Other Ambulatory Visit: Payer: Self-pay | Admitting: Family Medicine

## 2023-07-17 DIAGNOSIS — R4182 Altered mental status, unspecified: Secondary | ICD-10-CM

## 2023-07-17 DIAGNOSIS — Z8659 Personal history of other mental and behavioral disorders: Secondary | ICD-10-CM

## 2023-08-16 ENCOUNTER — Encounter: Payer: Self-pay | Admitting: Family Medicine

## 2023-08-16 ENCOUNTER — Encounter: Payer: Self-pay | Admitting: Gastroenterology

## 2023-08-17 NOTE — Telephone Encounter (Signed)
 Daniel Sams, DO     08/17/23 12:17 PM Needs to be seen.

## 2023-08-17 NOTE — Telephone Encounter (Signed)
 This patient was seen once in clinic in February 2024 for many years of chest wall pain.  I cannot make sense of the symptoms and concerns he is describing in that message, and he needs to be seen in the office.  Ellwood Dense MD

## 2023-08-18 ENCOUNTER — Ambulatory Visit: Payer: Self-pay | Admitting: Gastroenterology

## 2023-09-22 ENCOUNTER — Ambulatory Visit
Admission: RE | Admit: 2023-09-22 | Discharge: 2023-09-22 | Disposition: A | Discharge status: Home / Self Care | Payer: Medicaid Other | Source: Ambulatory Visit | Attending: Nurse Practitioner | Admitting: Nurse Practitioner

## 2023-09-22 VITALS — BP 125/82 | HR 77 | Temp 98.1°F | Resp 18

## 2023-09-22 DIAGNOSIS — J22 Unspecified acute lower respiratory infection: Secondary | ICD-10-CM | POA: Diagnosis not present

## 2023-09-22 DIAGNOSIS — R059 Cough, unspecified: Secondary | ICD-10-CM | POA: Diagnosis not present

## 2023-09-22 LAB — POCT RAPID STREP A (OFFICE): Rapid Strep A Screen: NEGATIVE

## 2023-09-22 MED ORDER — AZITHROMYCIN 250 MG PO TABS: 250.0000 mg | ORAL_TABLET | Freq: Every day | ORAL | 0 refills | Status: DC

## 2023-09-22 MED ORDER — PSEUDOEPH-BROMPHEN-DM 30-2-10 MG/5ML PO SYRP: 5.0000 mL | ORAL_SOLUTION | Freq: Four times a day (QID) | ORAL | 0 refills | Status: DC | PRN

## 2023-09-22 MED ORDER — PREDNISONE 20 MG PO TABS: 40.0000 mg | ORAL_TABLET | Freq: Every day | ORAL | 0 refills | Status: AC

## 2023-09-22 NOTE — ED Provider Notes (Signed)
[facility]    CSN: [id number] Arrival date & time: 09/22/23  0850      History   Chief Complaint No chief complaint on file.   HPI Daniel Phelps is a 38 y.o. male.   The history is provided by the patient.   Patient presents for complaints of cough and sore throat.  Patient states cough has been present for the past 2 weeks, sore throat started approximately 5 days ago.  States he had fever when symptoms initially started, which is since resolved.  Denies headache, nasal congestion, runny nose, wheezing, difficulty breathing, chest pain, abdominal pain, nausea, vomiting, diarrhea, or rash.  Patient reports he has been taking over-the-counter medications, symptoms have not improved.  Past Medical History:  Diagnosis Date   Anxiety    Chronic back pain    Depression    GERD (gastroesophageal reflux disease)    Substance abuse ([facility])     Patient Active Problem List   Diagnosis Date Noted   Difficulty concentrating 11/03/2022   Chronic chest wall pain 10/21/2022   GERD (gastroesophageal reflux disease) 09/22/2022   Schizophrenia spectrum disorder with psychotic disorder type not yet determined ([facility]) 09/11/2022   Class 1 obesity with body mass index (BMI) of 30.0 to 30.9 in adult 02/24/2018   Chronic back pain 01/29/2018    Past Surgical History:  Procedure Laterality Date   HERNIA REPAIR     LUMBAR LAMINECTOMY/DECOMPRESSION MICRODISCECTOMY N/A 07/04/2014   Procedure: LUMBAR LAMINECTOMY/DECOMPRESSION MICRODISCECTOMY 1 LEVEL,LEFT LUMBAR FIVE-SACRAL ONE;  Surgeon: Coletta Memos, MD;  Location: [facility];  Service: Neurosurgery;  Laterality: N/A;  left   SPINE SURGERY  07/04/2014   Left L5 semihemilaminectomy and L5/S1 discetomy        Home Medications    Prior to Admission medications   Medication Sig Start Date End Date Taking? Authorizing Provider  azithromycin (ZITHROMAX) 250 MG tablet Take 1 tablet (250 mg total) by mouth daily. Take first 2 tablets together,  then 1 every day until finished. 09/22/23  Yes Leath-Warren, Sadie Haber, NP  brompheniramine-pseudoephedrine-DM 30-2-10 MG/5ML syrup Take 5 mLs by mouth 4 (four) times daily as needed. 09/22/23  Yes Leath-Warren, Sadie Haber, NP  predniSONE (DELTASONE) 20 MG tablet Take 2 tablets (40 mg total) by mouth daily with breakfast for 5 days. 09/22/23 09/27/23 Yes Leath-Warren, Sadie Haber, NP  omeprazole (PRILOSEC) 40 MG capsule Take 1 capsule (40 mg total) by mouth 2 (two) times daily before a meal. 10/21/22   Tommie Sams, DO    Family History Family History  Problem Relation Age of Onset   Other Mother    Hernia Father    Diabetes Maternal Grandmother    Diabetes Maternal Grandfather    Liver disease Neg Hx    Esophageal cancer Neg Hx    Colon cancer Neg Hx     Social History Social History   Tobacco Use   Smoking status: Never   Smokeless tobacco: Never  Vaping Use   Vaping status: Never Used  Substance Use Topics   Alcohol use: Not Currently    Alcohol/week: 6.0 standard drinks of alcohol    Types: 6 Shots of liquor per week    Comment: last drink 2 days ago. usually drinks 3 days a week.   Drug use: Yes    Types: Marijuana    Comment: last used yesterday     Allergies   Ibuprofen   Review of Systems Review of Systems Per HPI  Physical Exam Triage  Vital Signs ED Triage Vitals  Encounter Vitals Group     BP 09/22/23 0902 125/82     Systolic BP Percentile --      Diastolic BP Percentile --      Pulse Rate 09/22/23 0902 77     Resp 09/22/23 0902 18     Temp 09/22/23 0902 98.1 F (36.7 C)     Temp Source 09/22/23 0902 Oral     SpO2 09/22/23 0902 95 %     Weight --      Height --      Head Circumference --      Peak Flow --      Pain Score 09/22/23 0903 7     Pain Loc --      Pain Education --      Exclude from Growth Chart --    No data found.  Updated Vital Signs BP 125/82 (BP Location: Right Arm)   Pulse 77   Temp 98.1 F (36.7 C) (Oral)   Resp 18    SpO2 95%   Visual Acuity Right Eye Distance:   Left Eye Distance:   Bilateral Distance:    Right Eye Near:   Left Eye Near:    Bilateral Near:     Physical Exam Vitals and nursing note reviewed.  Constitutional:      General: He is not in acute distress.    Appearance: Normal appearance.  HENT:     Head: Normocephalic.     Right Ear: Tympanic membrane, ear canal and external ear normal.     Left Ear: Tympanic membrane, ear canal and external ear normal.     Nose: Nose normal.     Mouth/Throat:     Lips: Pink.     Mouth: Mucous membranes are moist.     Pharynx: Uvula midline. Posterior oropharyngeal erythema and postnasal drip present. No pharyngeal swelling, oropharyngeal exudate or uvula swelling.  Eyes:     Extraocular Movements: Extraocular movements intact.     Conjunctiva/sclera: Conjunctivae normal.     Pupils: Pupils are equal, round, and reactive to light.  Cardiovascular:     Rate and Rhythm: Normal rate and regular rhythm.     Pulses: Normal pulses.     Heart sounds: Normal heart sounds.  Pulmonary:     Effort: Pulmonary effort is normal. No respiratory distress.     Breath sounds: Normal breath sounds. No stridor. No wheezing, rhonchi or rales.  Chest:     Chest wall: No tenderness.  Abdominal:     General: Bowel sounds are normal.     Palpations: Abdomen is soft.     Tenderness: There is no abdominal tenderness.  Musculoskeletal:     Cervical back: Normal range of motion.  Skin:    General: Skin is warm and dry.  Neurological:     General: No focal deficit present.     Mental Status: He is alert and oriented to person, place, and time.  Psychiatric:        Mood and Affect: Mood normal.        Behavior: Behavior normal.      UC Treatments / Results  Labs (all labs ordered are listed, but only abnormal results are displayed) Labs Reviewed  POCT RAPID STREP A (OFFICE) - Normal    EKG   Radiology No results found.  Procedures Procedures  (including critical care time)  Medications Ordered in UC Medications - No data to display  Initial Impression / Assessment  and Plan / UC Course  I have reviewed the triage vital signs and the nursing notes.  Pertinent labs & imaging results that were available during my care of the patient were reviewed by me and considered in my medical decision making (see chart for details).  On exam, lung sounds are clear throughout, room air sats at 95%.  Patient with persistent cough that is been present for the past 2 weeks, throat pain over the past 5 days.  Rapid strep test is negative.  Suspect throat pain is most likely due to the persistent cough.  Will treat patient for lower respiratory infection with azithromycin 250 mg, prednisone 40 mg for bronchial inflammation, and Bromfed-DM for cough.  Supportive care recommendations were provided and discussed with the patient to include fluids, rest, and over-the-counter analgesics.  Discussed indications regarding follow-up.  Patient was in agreement with this plan of care and verbalized understanding.  All questions were answered.  Patient stable for discharge.  Final Clinical Impressions(s) / UC Diagnoses   Final diagnoses:  Lower respiratory infection  Cough, unspecified type     Discharge Instructions      The rapid strep test was negative. Take medication as prescribed.  Make sure you are also taking medication for your reflux as this could also be contributing to your cough. Increase fluids and allow for plenty of rest. Warm salt water gargles 3-4 times daily as needed for throat pain or discomfort. Recommend using a humidifier in your bedroom at nighttime during sleep and sleeping elevated on pillows while cough symptoms persist. Please be advised that your cough can last for several weeks.  If you are generally feeling well, but continued to have a persistent nagging cough, recommend increasing your fluid intake.  You will need to  follow-up if you develop new symptoms such as shortness of breath, difficulty breathing, or wheezing. Follow-up with your primary care physician if symptoms fail to improve. Follow-up as needed.     ED Prescriptions     Medication Sig Dispense Auth. Provider   azithromycin (ZITHROMAX) 250 MG tablet Take 1 tablet (250 mg total) by mouth daily. Take first 2 tablets together, then 1 every day until finished. 6 tablet Leath-Warren, Sadie Haber, NP   predniSONE (DELTASONE) 20 MG tablet Take 2 tablets (40 mg total) by mouth daily with breakfast for 5 days. 10 tablet Leath-Warren, Sadie Haber, NP   brompheniramine-pseudoephedrine-DM 30-2-10 MG/5ML syrup Take 5 mLs by mouth 4 (four) times daily as needed. 140 mL Leath-Warren, Sadie Haber, NP      PDMP not reviewed this encounter.   Abran Cantor, NP 09/22/23 [phone]

## 2023-09-22 NOTE — ED Triage Notes (Signed)
Cough x 2 weeks.  Sore throat x 5 days. States cough is productive today.

## 2023-09-22 NOTE — Discharge Instructions (Signed)
The rapid strep test was negative. Take medication as prescribed.  Make sure you are also taking medication for your reflux as this could also be contributing to your cough. Increase fluids and allow for plenty of rest. Warm salt water gargles 3-4 times daily as needed for throat pain or discomfort. Recommend using a humidifier in your bedroom at nighttime during sleep and sleeping elevated on pillows while cough symptoms persist. Please be advised that your cough can last for several weeks.  If you are generally feeling well, but continued to have a persistent nagging cough, recommend increasing your fluid intake.  You will need to follow-up if you develop new symptoms such as shortness of breath, difficulty breathing, or wheezing. Follow-up with your primary care physician if symptoms fail to improve. Follow-up as needed.

## 2023-10-15 ENCOUNTER — Other Ambulatory Visit (HOSPITAL_COMMUNITY)
Admission: RE | Admit: 2023-10-15 | Discharge: 2023-10-15 | Disposition: A | Discharge status: Home / Self Care | Source: Ambulatory Visit | Attending: Family Medicine | Admitting: Family Medicine

## 2023-10-15 ENCOUNTER — Encounter: Payer: Self-pay | Admitting: Family Medicine

## 2023-10-15 ENCOUNTER — Ambulatory Visit: Payer: Medicaid Other | Admitting: Family Medicine

## 2023-10-15 VITALS — BP 132/95 | HR 71 | Temp 97.9°F | Ht 74.0 in | Wt 220.0 lb

## 2023-10-15 DIAGNOSIS — G8929 Other chronic pain: Secondary | ICD-10-CM | POA: Insufficient documentation

## 2023-10-15 DIAGNOSIS — R1012 Left upper quadrant pain: Secondary | ICD-10-CM

## 2023-10-15 LAB — COMPREHENSIVE METABOLIC PANEL
ALT: 25 U/L (ref 0–44)
AST: 20 U/L (ref 15–41)
Albumin: 4 g/dL (ref 3.5–5.0)
Alkaline Phosphatase: 60 U/L (ref 38–126)
Anion gap: 9 (ref 5–15)
BUN: 15 mg/dL (ref 6–20)
CO2: 26 mmol/L (ref 22–32)
Calcium: 9.1 mg/dL (ref 8.9–10.3)
Chloride: 103 mmol/L (ref 98–111)
Creatinine, Ser: 0.83 mg/dL (ref 0.61–1.24)
GFR, Estimated: >60 mL/min (ref >60)
Glucose, Bld: 93 mg/dL (ref 70–99)
Potassium: 4.1 mmol/L (ref 3.5–5.1)
Sodium: 138 mmol/L (ref 135–145)
Total Bilirubin: 0.4 mg/dL (ref 0.0–1.2)
Total Protein: 7.3 g/dL (ref 6.5–8.1)

## 2023-10-15 LAB — CBC WITH DIFFERENTIAL/PLATELET
Abs Immature Granulocytes: 0.02 10*3/uL (ref 0.00–0.07)
Basophils Absolute: 0 10*3/uL (ref 0.0–0.1)
Basophils Relative: 1 %
Eosinophils Absolute: 0.3 10*3/uL (ref 0.0–0.5)
Eosinophils Relative: 6 %
HCT: 41 % (ref 39.0–52.0)
Hemoglobin: 13.6 g/dL (ref 13.0–17.0)
Immature Granulocytes: 0 %
Lymphocytes Relative: 20 %
Lymphs Abs: 1 10*3/uL (ref 0.7–4.0)
MCH: 30.4 pg (ref 26.0–34.0)
MCHC: 33.2 g/dL (ref 30.0–36.0)
MCV: 91.7 fL (ref 80.0–100.0)
Monocytes Absolute: 0.6 10*3/uL (ref 0.1–1.0)
Monocytes Relative: 12 %
Neutro Abs: 2.9 10*3/uL (ref 1.7–7.7)
Neutrophils Relative %: 61 %
Platelets: 208 10*3/uL (ref 150–400)
RBC: 4.47 MIL/uL (ref 4.22–5.81)
RDW: 12.7 % (ref 11.5–15.5)
WBC: 4.9 10*3/uL (ref 4.0–10.5)
nRBC: 0 % (ref 0.0–0.2)

## 2023-10-15 LAB — LIPASE, BLOOD: Lipase: 36 U/L (ref 11–51)

## 2023-10-15 NOTE — Progress Notes (Signed)
 Subjective:  Patient ID: Daniel Phelps, male    DOB: 1985-11-11  Age: 38 y.o. MRN: [id number]  CC:   Chief Complaint  Patient presents with   left upper quadrant pain     3 months now worse    HPI:  38 year old male presents with chronic left upper quadrant pain.  This has been a longstanding issue.  He states that this has been worsening over the past 3 months.  He reports left upper quadrant pain/left sided rib pain.  He also reports that he has pain in the right upper quadrant and right ribs as well as abdominal pain and chest pain.  He has had some recent weight loss.  He states that his symptoms seem to be worse when he eats.  However, he denies early satiety.  He states that he is hungry often.  Patient has had negative ultrasound imaging previously.  Has previously been evaluated by GI as well.  Has had essentially unremarkable labs in the past as well.  He is very concerned given his worsening symptoms.   Patient Active Problem List   Diagnosis Date Noted   Chronic LUQ pain 10/15/2023   Difficulty concentrating 11/03/2022   Chronic chest wall pain 10/21/2022   GERD (gastroesophageal reflux disease) 09/22/2022   Schizophrenia spectrum disorder with psychotic disorder type not yet determined (HCC) 09/11/2022   Class 1 obesity with body mass index (BMI) of 30.0 to 30.9 in adult 02/24/2018   Chronic back pain 01/29/2018    Social Hx   Social History   Socioeconomic History   Marital status: Single    Spouse name: Not on file   Number of children: 3   Years of education: Not on file   Highest education level: Not on file  Occupational History   Occupation: self employe  Tobacco Use   Smoking status: Never   Smokeless tobacco: Never  Vaping Use   Vaping status: Never Used  Substance and Sexual Activity   Alcohol use: Not Currently    Alcohol/week: 6.0 standard drinks of alcohol    Types: 6 Shots of liquor per week    Comment: last drink 2 days ago. usually drinks 3  days a week.   Drug use: Yes    Types: Marijuana    Comment: last used yesterday   Sexual activity: Not Currently  Other Topics Concern   Not on file  Social History Narrative   Not on file   Social Drivers of Health   Financial Resource Strain: Not on file  Food Insecurity: Not on file  Transportation Needs: Not on file  Physical Activity: Unknown (02/24/2018)   Exercise Vital Sign    Days of Exercise per Week: 0 days    Minutes of Exercise per Session: Not on file  Stress: Not on file  Social Connections: Not on file    Review of Systems Per HPI  Objective:  BP (!) 132/95   Pulse 71   Temp 97.9 F (36.6 C)   Ht 6\' 2"  (1.88 m)   Wt 220 lb (99.8 kg)   SpO2 98%   BMI 28.25 kg/m      10/15/2023    8:44 AM 09/22/2023    9:02 AM 11/05/2022   12:32 PM  BP/Weight  Systolic BP 132 125 153  Diastolic BP 95 82 80  Wt. (Lbs) 220  238  BMI 28.25 kg/m2  30.56 kg/m2    Physical Exam Constitutional:      General: He  is not in acute distress.    Appearance: Normal appearance.  HENT:     Head: Normocephalic and atraumatic.  Cardiovascular:     Rate and Rhythm: Normal rate and regular rhythm.  Pulmonary:     Effort: Pulmonary effort is normal.     Breath sounds: Normal breath sounds. No wheezing, rhonchi or rales.  Abdominal:     General: There is no distension.     Palpations: Abdomen is soft.     Tenderness: There is no abdominal tenderness. There is no guarding or rebound.  Neurological:     Mental Status: He is alert.  Psychiatric:     Comments: Flat affect.     Lab Results  Component Value Date   WBC 7.7 01/10/2022   HGB 15.6 01/10/2022   HCT 45.5 01/10/2022   PLT 256 01/10/2022   GLUCOSE 124 (H) 10/13/2022   CHOL 149 01/10/2022   TRIG 195 (H) 01/10/2022   HDL 34 (L) 01/10/2022   LDLCALC 82 01/10/2022   ALT 40 10/13/2022   AST 22 10/13/2022   NA 140 10/13/2022   K 4.3 10/13/2022   CL 103 10/13/2022   CREATININE 1.02 10/13/2022   BUN 22 (H)  10/13/2022   CO2 23 10/13/2022   TSH 0.899 10/13/2022   HGBA1C 5.3 01/10/2022     Assessment & Plan:  Chronic LUQ pain Assessment & Plan: Etiology and prognosis unclear.  Recent worsening.  Given worsening symptoms and weight loss, obtaining further workup with labs and CT imaging.  Orders: -     CT ABDOMEN PELVIS W CONTRAST -     CBC with Differential/Platelet -     Comprehensive metabolic panel -     Lipase    Follow-up:  Pending work up.  Everlene Other DO [facility]

## 2023-10-15 NOTE — Assessment & Plan Note (Addendum)
 Etiology and prognosis unclear.  Recent worsening.  Given worsening symptoms and weight loss, obtaining further workup with labs and CT imaging.

## 2023-10-16 ENCOUNTER — Telehealth: Payer: Self-pay | Admitting: *Deleted

## 2023-10-16 NOTE — Telephone Encounter (Signed)
 Copied from CRM [id number]. Topic: General - Billing Inquiry >> Oct 16, 2023  8:49 AM Alessandra Bevels wrote: Reason for CRM: Pt is calling to see if the insurance approved his CT Scan?

## 2023-10-19 ENCOUNTER — Other Ambulatory Visit: Payer: Self-pay

## 2023-10-19 ENCOUNTER — Emergency Department (HOSPITAL_COMMUNITY)
Admission: EM | Admit: 2023-10-19 | Discharge: 2023-10-19 | Disposition: A | Discharge status: Home / Self Care | Attending: Emergency Medicine | Admitting: Emergency Medicine

## 2023-10-19 ENCOUNTER — Emergency Department (HOSPITAL_COMMUNITY)

## 2023-10-19 ENCOUNTER — Encounter (HOSPITAL_COMMUNITY): Payer: Self-pay

## 2023-10-19 DIAGNOSIS — K769 Liver disease, unspecified: Secondary | ICD-10-CM

## 2023-10-19 DIAGNOSIS — R079 Chest pain, unspecified: Secondary | ICD-10-CM | POA: Insufficient documentation

## 2023-10-19 DIAGNOSIS — R1013 Epigastric pain: Secondary | ICD-10-CM | POA: Insufficient documentation

## 2023-10-19 DIAGNOSIS — R0602 Shortness of breath: Secondary | ICD-10-CM | POA: Insufficient documentation

## 2023-10-19 DIAGNOSIS — R61 Generalized hyperhidrosis: Secondary | ICD-10-CM | POA: Diagnosis not present

## 2023-10-19 LAB — TROPONIN I (HIGH SENSITIVITY): Troponin I (High Sensitivity): 3 ng/L (ref <18)

## 2023-10-19 LAB — CBC WITH DIFFERENTIAL/PLATELET
Abs Immature Granulocytes: 0.01 10*3/uL (ref 0.00–0.07)
Basophils Absolute: 0 10*3/uL (ref 0.0–0.1)
Basophils Relative: 0 %
Eosinophils Absolute: 0.1 10*3/uL (ref 0.0–0.5)
Eosinophils Relative: 2 %
HCT: 43.3 % (ref 39.0–52.0)
Hemoglobin: 14.4 g/dL (ref 13.0–17.0)
Immature Granulocytes: 0 %
Lymphocytes Relative: 27 %
Lymphs Abs: 1.5 10*3/uL (ref 0.7–4.0)
MCH: 30.6 pg (ref 26.0–34.0)
MCHC: 33.3 g/dL (ref 30.0–36.0)
MCV: 92.1 fL (ref 80.0–100.0)
Monocytes Absolute: 0.6 10*3/uL (ref 0.1–1.0)
Monocytes Relative: 11 %
Neutro Abs: 3.3 10*3/uL (ref 1.7–7.7)
Neutrophils Relative %: 60 %
Platelets: 253 10*3/uL (ref 150–400)
RBC: 4.7 MIL/uL (ref 4.22–5.81)
RDW: 12.4 % (ref 11.5–15.5)
WBC: 5.6 10*3/uL (ref 4.0–10.5)
nRBC: 0 % (ref 0.0–0.2)

## 2023-10-19 LAB — COMPREHENSIVE METABOLIC PANEL
ALT: 21 U/L (ref 0–44)
AST: 18 U/L (ref 15–41)
Albumin: 4.1 g/dL (ref 3.5–5.0)
Alkaline Phosphatase: 57 U/L (ref 38–126)
Anion gap: 9 (ref 5–15)
BUN: 17 mg/dL (ref 6–20)
CO2: 26 mmol/L (ref 22–32)
Calcium: 8.9 mg/dL (ref 8.9–10.3)
Chloride: 103 mmol/L (ref 98–111)
Creatinine, Ser: 0.89 mg/dL (ref 0.61–1.24)
GFR, Estimated: >60 mL/min (ref >60)
Glucose, Bld: 119 mg/dL — ABNORMAL HIGH (ref 70–99)
Potassium: 3.5 mmol/L (ref 3.5–5.1)
Sodium: 138 mmol/L (ref 135–145)
Total Bilirubin: 0.5 mg/dL (ref 0.0–1.2)
Total Protein: 7.6 g/dL (ref 6.5–8.1)

## 2023-10-19 LAB — URINALYSIS, ROUTINE W REFLEX MICROSCOPIC
Bilirubin Urine: NEGATIVE
Glucose, UA: NEGATIVE mg/dL
Hgb urine dipstick: NEGATIVE
Ketones, ur: NEGATIVE mg/dL
Leukocytes,Ua: NEGATIVE
Nitrite: NEGATIVE
Protein, ur: NEGATIVE mg/dL
Specific Gravity, Urine: 1.021 (ref 1.005–1.030)
pH: 6 (ref 5.0–8.0)

## 2023-10-19 LAB — LIPASE, BLOOD: Lipase: 38 U/L (ref 11–51)

## 2023-10-19 MED ORDER — PANTOPRAZOLE SODIUM 20 MG PO TBEC: 20.0000 mg | DELAYED_RELEASE_TABLET | Freq: Every day | ORAL | 0 refills | Status: DC

## 2023-10-19 MED ORDER — IOHEXOL 350 MG/ML SOLN
100.0000 mL | Freq: Once | INTRAVENOUS | Status: AC | PRN
  Administered 2023-10-19: 100 mL via INTRAVENOUS

## 2023-10-19 MED ORDER — SUCRALFATE 1 G PO TABS: 1.0000 g | ORAL_TABLET | Freq: Three times a day (TID) | ORAL | 0 refills | Status: DC

## 2023-10-19 NOTE — ED Provider Notes (Signed)
 [place] EMERGENCY DEPARTMENT AT [facility] Provider Note   CSN: [id number] Arrival date & time: 10/19/23  1533     History  Chief Complaint  Patient presents with   Chest Pain    Daniel Phelps is a 38 y.o. male.  Patient is a 38 year old male who presents emergency department the chief complaint of epigastric Donnell pain which has been ongoing for approximate the past 6 months.  He notes that he is being followed by his primary care doctor on an outpatient basis but has not had any imaging performed secondary to insurance complications.  Patient denies any associated dizziness, lightheadedness or syncope.  He notes he did have some increased chest pain this morning as well as diaphoresis.  He also notes that he had some increased sharp pains to his abdomen over the past few days.  He notes that the pain is worse after eating.  He does note that he has had associated shortness of breath.  He denies any known history of cardiac or pulmonary disease.  He denies any history of previous abdominal surgeries.   Chest Pain Associated symptoms: abdominal pain and shortness of breath        Home Medications Prior to Admission medications   Medication Sig Start Date End Date Taking? Authorizing Provider  brompheniramine-pseudoephedrine-DM 30-2-10 MG/5ML syrup Take 5 mLs by mouth 4 (four) times daily as needed. 09/22/23   Leath-Warren, Sadie Haber, NP  omeprazole (PRILOSEC) 40 MG capsule Take 1 capsule (40 mg total) by mouth 2 (two) times daily before a meal. 10/21/22   Tommie Sams, DO      Allergies    Ibuprofen    Review of Systems   Review of Systems  Respiratory:  Positive for shortness of breath.   Cardiovascular:  Positive for chest pain.  Gastrointestinal:  Positive for abdominal pain.  All other systems reviewed and are negative.   Physical Exam Updated Vital Signs BP (!) 151/92 (BP Location: Right Arm)   Pulse 68   Temp 97.8 F (36.6 C) (Temporal)   Resp 18    Ht 6\' 2"  (1.88 m)   Wt 99.8 kg   SpO2 99%   BMI 28.25 kg/m  Physical Exam Vitals and nursing note reviewed.  Constitutional:      Appearance: Normal appearance.  HENT:     Head: Normocephalic and atraumatic.     Nose: Nose normal.     Mouth/Throat:     Mouth: Mucous membranes are moist.  Eyes:     Extraocular Movements: Extraocular movements intact.     Conjunctiva/sclera: Conjunctivae normal.     Pupils: Pupils are equal, round, and reactive to light.  Cardiovascular:     Rate and Rhythm: Normal rate and regular rhythm.     Pulses: Normal pulses.     Heart sounds: Normal heart sounds. No murmur heard. Pulmonary:     Effort: Pulmonary effort is normal. No tachypnea or respiratory distress.     Breath sounds: Normal breath sounds. No decreased breath sounds, wheezing, rhonchi or rales.  Chest:     Chest wall: No tenderness.  Abdominal:     General: Abdomen is flat. Bowel sounds are normal.     Palpations: Abdomen is soft. There is no mass.     Tenderness: There is abdominal tenderness. There is no guarding or rebound.     Comments: Tender to palpation to epigastric region  Musculoskeletal:        General: Normal range of  motion.     Cervical back: Normal range of motion and neck supple.  Skin:    General: Skin is warm and dry.     Findings: No rash.  Neurological:     General: No focal deficit present.     Mental Status: He is alert and oriented to person, place, and time. Mental status is at baseline.  Psychiatric:        Mood and Affect: Mood normal.        Behavior: Behavior normal.        Thought Content: Thought content normal.        Judgment: Judgment normal.     ED Results / Procedures / Treatments   Labs (all labs ordered are listed, but only abnormal results are displayed) Labs Reviewed  LIPASE, BLOOD  COMPREHENSIVE METABOLIC PANEL  URINALYSIS, ROUTINE W REFLEX MICROSCOPIC  CBC WITH DIFFERENTIAL/PLATELET  TROPONIN I (HIGH SENSITIVITY)    EKG EKG  Interpretation Date/Time:  Monday October 19 2023 15:48:25 EDT Ventricular Rate:  78 PR Interval:  162 QRS Duration:  90 QT Interval:  380 QTC Calculation: 433 R Axis:   84  Text Interpretation: Sinus rhythm with marked sinus arrhythmia  ST elevations overall similar to 2022 Confirmed by Pricilla Loveless [id number]) on 10/19/2023 3:57:45 PM  Radiology No results found.  Procedures Procedures    Medications Ordered in ED Medications - No data to display  ED Course/ Medical Decision Making/ A&P                                 Medical Decision Making Amount and/or Complexity of Data Reviewed Labs: ordered. Radiology: ordered.  Risk Prescription drug management.   This patient presents to the ED for concern of chest pain, epigastric pain differential diagnosis includes acute appendicitis, cholecystitis, diverticulitis, small bowel obstruction, pancreatitis, mesenteric ischemia, ACS, pulmonary embolus, pericarditis, endocarditis, myocarditis, pneumothorax    Additional history obtained:  Additional history obtained from records External records from outside source obtained and reviewed including medical records   Lab Tests:  I Ordered, and personally interpreted labs.  The pertinent results include: Normal troponin, normal lipase, normal kidney function, liver function, electrolytes, no leukocytosis   Imaging Studies ordered:  I ordered imaging studies including CT scan of chest, abdomen, pelvis I independently visualized and interpreted imaging which showed no pulmonary embolus, no pneumothorax, hemothorax, no acute intra-abdominal surgical process, liver lesion I agree with the radiologist interpretation   Medicines ordered and prescription drug management:  I ordered medication including Carafate, Protonix for peptic ulcer Reevaluation of the patient after these medicines showed that the patient improved I have reviewed the patients home medicines and have made  adjustments as needed   Problem List / ED Course:  Patient is doing well at this time and is stable for discharge home.  Discussed with patient that all workup in the emergency department has been unremarkable.  Patient has no indication for underlying acute surgical process at this point within the abdomen and pelvis.  CTA of the chest demonstrated no signs of pulmonary embolus or other intrathoracic process.  Patient has no indication for ACS as EKG has no acute ischemic changes and patient has negative troponin.  Do not suspect pericarditis or myocarditis as symptoms were not positional in nature.  Patient has no indication for endocarditis.  Suspect the patient is suffering from peptic ulcer disease at this time.  Will treat accordingly on  an outpatient basis and recommend close follow-up with gastroenterology.  Patient was made aware of the liver lesion on CT scan and was provided with a copy of the report.  He will follow-up with his primary care doctor regarding this.  Strict return precautions were discussed for any new or worsening symptoms.  Patient voiced understanding and had no additional questions.   Social Determinants of Health:  None           Final Clinical Impression(s) / ED Diagnoses Final diagnoses:  None    Rx / DC Orders ED Discharge Orders     None         Kathlen Mody 10/19/23 2132    Pricilla Loveless, MD 10/20/23 1730

## 2023-10-19 NOTE — ED Notes (Signed)
 Pt reports he has a CT Scan Abdomen Pelvis with Contrast scheduled as well as MRI but has been waiting for these scans to be scheduled.

## 2023-10-19 NOTE — Discharge Instructions (Signed)
 Please follow-up closely with your primary care doctor and gastroenterology on an outpatient basis.  You were provided with a copy of your CT scan report and please follow-up with your primary care doctor regarding the finding of the liver lesion.  Return to emergency department immediately for any new or worsening symptoms.

## 2023-10-19 NOTE — ED Triage Notes (Signed)
 Pt arrived via POV from doctors office c/o left chest pain, sharp abdominal pain, and SOB. Pt reports some symptoms began apprx 1 month ago, but chest pain and sweating on his left side began today.

## 2023-10-26 ENCOUNTER — Ambulatory Visit: Payer: Self-pay | Admitting: Family Medicine

## 2023-10-26 NOTE — Telephone Encounter (Addendum)
 Attempted to call the patient 3 times without success. Routing to office.    Copied from CRM [id number]. Topic: Appointments - Appointment Scheduling >> Oct 26, 2023 11:49 AM Emylou G wrote: Patient is having worsening stomach pains.. pls call patient [phone] ( is open to be seen anytime Friday.Marland Kitchen )

## 2023-10-28 NOTE — Telephone Encounter (Signed)
 Scan completed via ER

## 2023-10-29 ENCOUNTER — Encounter: Payer: Self-pay | Admitting: Family Medicine

## 2023-10-30 ENCOUNTER — Ambulatory Visit: Admitting: Family Medicine

## 2023-10-30 VITALS — BP 118/76 | HR 58 | Temp 98.6°F | Ht 74.0 in | Wt 222.8 lb

## 2023-10-30 DIAGNOSIS — R109 Unspecified abdominal pain: Secondary | ICD-10-CM

## 2023-10-30 DIAGNOSIS — G8929 Other chronic pain: Secondary | ICD-10-CM | POA: Diagnosis not present

## 2023-10-30 NOTE — Patient Instructions (Addendum)
 This is the number for Dr. Irving Burton office.  Overall, everything looks good.   Try changing your diet.

## 2023-11-02 DIAGNOSIS — G8929 Other chronic pain: Secondary | ICD-10-CM | POA: Insufficient documentation

## 2023-11-02 HISTORY — DX: Other chronic pain: G89.29

## 2023-11-02 NOTE — Assessment & Plan Note (Addendum)
 Advised patient that the liver lesion is most likely benign and is not causing his pain.  Patient is concerned that he may need an endoscopy.  He has previously seen GI.  Advised him to reach out to GI to discuss scheduling EGD. Information given regarding low FODMAP diet.  Perhaps changing his diet will help.  He states that he only eats 1 meal a day.

## 2023-11-02 NOTE — Progress Notes (Signed)
 Subjective:  Patient ID: Daniel Phelps, male    DOB: 12/13/1985  Age: 38 y.o. MRN: [id number]  CC:  Follow up from recent ED visit   HPI:  38 year old male presents for follow-up regarding recent ED visit.  Patient continues to have issues regarding right upper quadrant/right rib pain and left upper quadrant/left rib pain.  In the ER he had epigastric tenderness to palpation.  CT scan of the chest as well as abdomen and pelvis was obtained.  No findings noted of the chest.  CT abdomen pelvis revealed a nonobstructing left renal calculus and a 1 cm hypodensity in the right lobe of the liver favored to be a cyst or hemangioma.  Patient presents today for follow-up.  He states that he continues to have pain particularly in the upper abdomen as well as the lower abdomen.  He also has some discomfort in the chest.  He is concerned about the liver lesion.  Will discuss today.  Patient Active Problem List   Diagnosis Date Noted   Chronic abdominal pain 11/02/2023   Difficulty concentrating 11/03/2022   Chronic chest wall pain 10/21/2022   GERD (gastroesophageal reflux disease) 09/22/2022   Schizophrenia spectrum disorder with psychotic disorder type not yet determined (HCC) 09/11/2022   Class 1 obesity with body mass index (BMI) of 30.0 to 30.9 in adult 02/24/2018   Chronic back pain 01/29/2018    Social Hx   Social History   Socioeconomic History   Marital status: Single    Spouse name: Not on file   Number of children: 3   Years of education: Not on file   Highest education level: Not on file  Occupational History   Occupation: self employe  Tobacco Use   Smoking status: Never   Smokeless tobacco: Never  Vaping Use   Vaping status: Never Used  Substance and Sexual Activity   Alcohol use: Not Currently    Alcohol/week: 6.0 standard drinks of alcohol    Types: 6 Shots of liquor per week    Comment: last drink 2 days ago. usually drinks 3 days a week.   Drug use: Yes    Types:  Marijuana    Comment: last used yesterday   Sexual activity: Not Currently  Other Topics Concern   Not on file  Social History Narrative   Not on file   Social Drivers of Health   Financial Resource Strain: Not on file  Food Insecurity: Not on file  Transportation Needs: Not on file  Physical Activity: Unknown (02/24/2018)   Exercise Vital Sign    Days of Exercise per Week: 0 days    Minutes of Exercise per Session: Not on file  Stress: Not on file  Social Connections: Not on file    Review of Systems Per HPI  Objective:  BP 118/76   Pulse (!) 58   Temp 98.6 F (37 C)   Ht 6\' 2"  (1.88 m)   Wt 222 lb 12.8 oz (101.1 kg)   SpO2 99%   BMI 28.61 kg/m      10/30/2023    9:18 AM 10/19/2023    9:15 PM 10/19/2023    8:45 PM  BP/Weight  Systolic BP 118 145 125  Diastolic BP 76 106 84  Wt. (Lbs) 222.8    BMI 28.61 kg/m2      Physical Exam Vitals and nursing note reviewed.  Constitutional:      General: He is not in acute distress.    Appearance: Normal  appearance.  HENT:     Head: Normocephalic and atraumatic.  Eyes:     General:        Right eye: No discharge.        Left eye: No discharge.     Conjunctiva/sclera: Conjunctivae normal.  Neurological:     Mental Status: He is alert.  Psychiatric:     Comments: Flat affect.     Lab Results  Component Value Date   WBC 5.6 10/19/2023   HGB 14.4 10/19/2023   HCT 43.3 10/19/2023   PLT 253 10/19/2023   GLUCOSE 119 (H) 10/19/2023   CHOL 149 01/10/2022   TRIG 195 (H) 01/10/2022   HDL 34 (L) 01/10/2022   LDLCALC 82 01/10/2022   ALT 21 10/19/2023   AST 18 10/19/2023   NA 138 10/19/2023   K 3.5 10/19/2023   CL 103 10/19/2023   CREATININE 0.89 10/19/2023   BUN 17 10/19/2023   CO2 26 10/19/2023   TSH 0.899 10/13/2022   HGBA1C 5.3 01/10/2022     Assessment & Plan:  Chronic abdominal pain Assessment & Plan: Advised patient that the liver lesion is most likely benign and is not causing his pain.  Patient  is concerned that he may need an endoscopy.  He has previously seen GI.  Advised him to reach out to GI to discuss scheduling EGD. Information given regarding low FODMAP diet.  Perhaps changing his diet will help.  He states that he only eats 1 meal a day.     Follow-up:  Return if symptoms worsen or fail to improve.  Everlene Other DO [facility]

## 2023-11-18 ENCOUNTER — Telehealth (INDEPENDENT_AMBULATORY_CARE_PROVIDER_SITE_OTHER): Payer: Self-pay | Admitting: Gastroenterology

## 2023-11-18 ENCOUNTER — Encounter (INDEPENDENT_AMBULATORY_CARE_PROVIDER_SITE_OTHER): Payer: Self-pay | Admitting: Gastroenterology

## 2023-11-18 ENCOUNTER — Ambulatory Visit (INDEPENDENT_AMBULATORY_CARE_PROVIDER_SITE_OTHER): Admitting: Gastroenterology

## 2023-11-18 VITALS — BP 123/79 | HR 71 | Temp 98.0°F | Ht 74.0 in | Wt 222.0 lb

## 2023-11-18 DIAGNOSIS — K769 Liver disease, unspecified: Secondary | ICD-10-CM | POA: Insufficient documentation

## 2023-11-18 DIAGNOSIS — K5904 Chronic idiopathic constipation: Secondary | ICD-10-CM

## 2023-11-18 DIAGNOSIS — R1012 Left upper quadrant pain: Secondary | ICD-10-CM | POA: Diagnosis not present

## 2023-11-18 DIAGNOSIS — K5900 Constipation, unspecified: Secondary | ICD-10-CM

## 2023-11-18 HISTORY — DX: Liver disease, unspecified: K76.9

## 2023-11-18 HISTORY — DX: Chronic idiopathic constipation: K59.04

## 2023-11-18 MED ORDER — METAMUCIL SMOOTH TEXTURE 58.6 % PO POWD: 1.0000 | Freq: Three times a day (TID) | ORAL | 12 refills | Status: DC

## 2023-11-18 NOTE — Telephone Encounter (Signed)
 MRI pending via [facility] No PA needed for EGD

## 2023-11-18 NOTE — H&P (View-Only) (Signed)
 Daniel Phelps , M.D. Gastroenterology & Hepatology [facility] Gastroenterology [address] [place], [place] [address] Primary Care Physician: Tommie Sams, DO [address] [name]  Chief Complaint:  Abdominal pain , Liver lesion , intermittent constipation    History of Present Illness: Daniel Phelps is a 38 y.o. male with no significant medical problems who presents for evaluation of Abdominal pain , Liver lesion , intermittent constipation   Patient reports he has felt left upper quadrant discomfort since past 2 years but the nature of the pain is increasing.  He reports pain is exacerbated with food intake and is relieved with fasting state.  The pain with origin and on left upper quadrant goes around right upper and epigastric area.  Again patient has noticed clear relationship with food intake.  Denies any NSAID use but takes Tylenol as needed  Patient reports Bristol stool scale type I-II bowel movement every 1 to 2 days with some straining.  The patient denies having any nausea, vomiting, fever, chills, hematochezia, melena, hematemesis, abdominal distention, diarrhea, jaundice, pruritus or weight loss.  Last GNF:AOZH Last Colonoscopy:none  FHx: neg for any gastrointestinal/liver disease, no malignancies Social: neg smoking, alcohol or illicit drug use Surgical: no abdominal surgeries  Past Medical History: Past Medical History:  Diagnosis Date   Anxiety    Chronic back pain    Depression    GERD (gastroesophageal reflux disease)    Substance abuse (HCC)     Past Surgical History: Past Surgical History:  Procedure Laterality Date   HERNIA REPAIR     LUMBAR LAMINECTOMY/DECOMPRESSION MICRODISCECTOMY N/A 07/04/2014   Procedure: LUMBAR LAMINECTOMY/DECOMPRESSION MICRODISCECTOMY 1 LEVEL,LEFT LUMBAR FIVE-SACRAL ONE;  Surgeon: Coletta Memos, MD;  Location: [facility] OR;  Service: Neurosurgery;  Laterality: N/A;  left   SPINE  SURGERY  07/04/2014   Left L5 semihemilaminectomy and L5/S1 discetomy     Family History: Family History  Problem Relation Age of Onset   Other Mother    Hernia Father    Diabetes Maternal Grandmother    Diabetes Maternal Grandfather    Liver disease Neg Hx    Esophageal cancer Neg Hx    Colon cancer Neg Hx     Social History: Social History   Tobacco Use  Smoking Status Never  Smokeless Tobacco Never   Social History   Substance and Sexual Activity  Alcohol Use Not Currently   Alcohol/week: 6.0 standard drinks of alcohol   Types: 6 Shots of liquor per week   Comment: last drink 2 days ago. usually drinks 3 days a week.   Social History   Substance and Sexual Activity  Drug Use Yes   Types: Marijuana   Comment: last used yesterday    Allergies: Allergies  Allergen Reactions   Ibuprofen Other (See Comments)    Sends pain down spine    Medications: Current Outpatient Medications  Medication Sig Dispense Refill   brompheniramine-pseudoephedrine-DM 30-2-10 MG/5ML syrup Take 5 mLs by mouth 4 (four) times daily as needed. 140 mL 0   omeprazole (PRILOSEC) 40 MG capsule Take 1 capsule (40 mg total) by mouth 2 (two) times daily before a meal. 60 capsule 3   pantoprazole (PROTONIX) 20 MG tablet Take 1 tablet (20 mg total) by mouth daily. 30 tablet 0   psyllium (METAMUCIL SMOOTH TEXTURE) 58.6 % powder Take 1 packet by mouth 3 (three) times daily. 283 g 12   sucralfate (CARAFATE) 1 g tablet Take 1  tablet (1 g total) by mouth 4 (four) times daily -  with meals and at bedtime. 120 tablet 0   No current facility-administered medications for this visit.    Review of Systems: GENERAL: negative for malaise, night sweats HEENT: No changes in hearing or vision, no nose bleeds or other nasal problems. NECK: Negative for lumps, goiter, pain and significant neck swelling RESPIRATORY: Negative for cough, wheezing CARDIOVASCULAR: Negative for chest pain, leg swelling,  palpitations, orthopnea GI: SEE HPI MUSCULOSKELETAL: Negative for joint pain or swelling, back pain, and muscle pain. SKIN: Negative for lesions, rash HEMATOLOGY Negative for prolonged bleeding, bruising easily, and swollen nodes. ENDOCRINE: Negative for cold or heat intolerance, polyuria, polydipsia and goiter. NEURO: negative for tremor, gait imbalance, syncope and seizures. The remainder of the review of systems is noncontributory.   Physical Exam: BP 123/79   Pulse 71   Temp 98 F (36.7 C)   Ht 6\' 2"  (1.88 m)   Wt 222 lb (100.7 kg)   BMI 28.50 kg/m  GENERAL: The patient is AO x3, in no acute distress. HEENT: Head is normocephalic and atraumatic. EOMI are intact. Mouth is well hydrated and without lesions. NECK: Supple. No masses LUNGS: Clear to auscultation. No presence of rhonchi/wheezing/rales. Adequate chest expansion HEART: RRR, normal s1 and s2. ABDOMEN: Soft, nontender, no guarding, no peritoneal signs, and nondistended. BS +. No masses.  Imaging/Labs: as above     Latest Ref Rng & Units 10/19/2023    4:12 PM 10/15/2023   10:18 AM 01/10/2022    3:08 PM  CBC  WBC 4.0 - 10.5 K/uL 5.6  4.9  7.7   Hemoglobin 13.0 - 17.0 g/dL 09.8  11.9  14.7   Hematocrit 39.0 - 52.0 % 43.3  41.0  45.5   Platelets 150 - 400 K/uL 253  208  256    No results found for: "IRON", "TIBC", "FERRITIN"  I personally reviewed and interpreted the available labs, imaging and endoscopic files. CT Abdomen    IMPRESSION: 1. No acute localizing process in the abdomen or pelvis. 2. Nonobstructing left renal calculus. 3. Colonic diverticulosis without evidence for diverticulitis. 4. Indeterminate 1 cm hypodensity in the right lobe of the liver, favored as complex cyst or hemangioma. This can be further characterized with MRI if clinically warranted.  Impression and Plan:  Daniel Phelps is a 38 y.o. male with no significant medical problems who presents for evaluation of Abdominal pain , Liver  lesion , intermittent constipation   #Abdominal pain   Patient has upper abdominal pain with relationship with food intake indicating GI origin.  Pain is exacerbated postprandially and relieves by fasting state this could be peptic ulcer disease  This also could be pain from left renal stone as seen on on the CT scan  There could be a competent of referred pain from chronic back issues  Without upper endoscopy I am unable to rule out peptic ulcer disease  I thoroughly discussed with the patient the procedure, including the risks involved. Patient understands what the procedure involves including the benefits and any risks. Patient understands alternatives to the proposed procedure. Risks including (but not limited to) bleeding, tearing of the lining (perforation), rupture of adjacent organs, problems with heart and lung function, infection, and medication reactions. A small percentage of complications may require surgery, hospitalization, repeat endoscopic procedure, and/or transfusion.  Patient understood and agreed.   If above workup is negative and pain is not relieved may refer the patient to evaluate  for gallbladder disease or trigger point injection for abdominal wall pain  #Constipation   Patient intermittently has constipation with Bristol stool scale type I-II.  There could be a component of IBS-C  Will start patient on Metamucil twice daily and Ibgard Ensure adequate fluid intake: Aim for 8 glasses of water daily. Follow a high fiber diet: Include foods such as dates, prunes, pears, and kiwi.  #Liver lesion  CT abdomen pelvis demonstrating either a complex liver cyst or hemangioma.  To better evaluate this lesion MRI hepatic protocol is indicated.  Will obtain MRI abdomen with IV contrast hepatic protocol  All questions were answered.      Daniel Lawman, MD Gastroenterology and Hepatology [facility] Gastroenterology   This chart has been completed using  [facility] Dictation software, and while attempts have been made to ensure accuracy , certain words and phrases may not be transcribed as intended

## 2023-11-18 NOTE — Progress Notes (Signed)
 Vista Lawman , M.D. Gastroenterology & Hepatology [facility] Gastroenterology [address] [place], [place] [address] Primary Care Physician: Tommie Sams, DO [address] [name]  Chief Complaint:  Abdominal pain , Liver lesion , intermittent constipation    History of Present Illness: Daniel Phelps is a 38 y.o. male with no significant medical problems who presents for evaluation of Abdominal pain , Liver lesion , intermittent constipation   Patient reports he has felt left upper quadrant discomfort since past 2 years but the nature of the pain is increasing.  He reports pain is exacerbated with food intake and is relieved with fasting state.  The pain with origin and on left upper quadrant goes around right upper and epigastric area.  Again patient has noticed clear relationship with food intake.  Denies any NSAID use but takes Tylenol as needed  Patient reports Bristol stool scale type I-II bowel movement every 1 to 2 days with some straining.  The patient denies having any nausea, vomiting, fever, chills, hematochezia, melena, hematemesis, abdominal distention, diarrhea, jaundice, pruritus or weight loss.  Last GNF:AOZH Last Colonoscopy:none  FHx: neg for any gastrointestinal/liver disease, no malignancies Social: neg smoking, alcohol or illicit drug use Surgical: no abdominal surgeries  Past Medical History: Past Medical History:  Diagnosis Date   Anxiety    Chronic back pain    Depression    GERD (gastroesophageal reflux disease)    Substance abuse (HCC)     Past Surgical History: Past Surgical History:  Procedure Laterality Date   HERNIA REPAIR     LUMBAR LAMINECTOMY/DECOMPRESSION MICRODISCECTOMY N/A 07/04/2014   Procedure: LUMBAR LAMINECTOMY/DECOMPRESSION MICRODISCECTOMY 1 LEVEL,LEFT LUMBAR FIVE-SACRAL ONE;  Surgeon: Coletta Memos, MD;  Location: [facility] OR;  Service: Neurosurgery;  Laterality: N/A;  left   SPINE  SURGERY  07/04/2014   Left L5 semihemilaminectomy and L5/S1 discetomy     Family History: Family History  Problem Relation Age of Onset   Other Mother    Hernia Father    Diabetes Maternal Grandmother    Diabetes Maternal Grandfather    Liver disease Neg Hx    Esophageal cancer Neg Hx    Colon cancer Neg Hx     Social History: Social History   Tobacco Use  Smoking Status Never  Smokeless Tobacco Never   Social History   Substance and Sexual Activity  Alcohol Use Not Currently   Alcohol/week: 6.0 standard drinks of alcohol   Types: 6 Shots of liquor per week   Comment: last drink 2 days ago. usually drinks 3 days a week.   Social History   Substance and Sexual Activity  Drug Use Yes   Types: Marijuana   Comment: last used yesterday    Allergies: Allergies  Allergen Reactions   Ibuprofen Other (See Comments)    Sends pain down spine    Medications: Current Outpatient Medications  Medication Sig Dispense Refill   brompheniramine-pseudoephedrine-DM 30-2-10 MG/5ML syrup Take 5 mLs by mouth 4 (four) times daily as needed. 140 mL 0   omeprazole (PRILOSEC) 40 MG capsule Take 1 capsule (40 mg total) by mouth 2 (two) times daily before a meal. 60 capsule 3   pantoprazole (PROTONIX) 20 MG tablet Take 1 tablet (20 mg total) by mouth daily. 30 tablet 0   psyllium (METAMUCIL SMOOTH TEXTURE) 58.6 % powder Take 1 packet by mouth 3 (three) times daily. 283 g 12   sucralfate (CARAFATE) 1 g tablet Take 1  tablet (1 g total) by mouth 4 (four) times daily -  with meals and at bedtime. 120 tablet 0   No current facility-administered medications for this visit.    Review of Systems: GENERAL: negative for malaise, night sweats HEENT: No changes in hearing or vision, no nose bleeds or other nasal problems. NECK: Negative for lumps, goiter, pain and significant neck swelling RESPIRATORY: Negative for cough, wheezing CARDIOVASCULAR: Negative for chest pain, leg swelling,  palpitations, orthopnea GI: SEE HPI MUSCULOSKELETAL: Negative for joint pain or swelling, back pain, and muscle pain. SKIN: Negative for lesions, rash HEMATOLOGY Negative for prolonged bleeding, bruising easily, and swollen nodes. ENDOCRINE: Negative for cold or heat intolerance, polyuria, polydipsia and goiter. NEURO: negative for tremor, gait imbalance, syncope and seizures. The remainder of the review of systems is noncontributory.   Physical Exam: BP 123/79   Pulse 71   Temp 98 F (36.7 C)   Ht 6\' 2"  (1.88 m)   Wt 222 lb (100.7 kg)   BMI 28.50 kg/m  GENERAL: The patient is AO x3, in no acute distress. HEENT: Head is normocephalic and atraumatic. EOMI are intact. Mouth is well hydrated and without lesions. NECK: Supple. No masses LUNGS: Clear to auscultation. No presence of rhonchi/wheezing/rales. Adequate chest expansion HEART: RRR, normal s1 and s2. ABDOMEN: Soft, nontender, no guarding, no peritoneal signs, and nondistended. BS +. No masses.  Imaging/Labs: as above     Latest Ref Rng & Units 10/19/2023    4:12 PM 10/15/2023   10:18 AM 01/10/2022    3:08 PM  CBC  WBC 4.0 - 10.5 K/uL 5.6  4.9  7.7   Hemoglobin 13.0 - 17.0 g/dL 09.8  11.9  14.7   Hematocrit 39.0 - 52.0 % 43.3  41.0  45.5   Platelets 150 - 400 K/uL 253  208  256    No results found for: "IRON", "TIBC", "FERRITIN"  I personally reviewed and interpreted the available labs, imaging and endoscopic files. CT Abdomen    IMPRESSION: 1. No acute localizing process in the abdomen or pelvis. 2. Nonobstructing left renal calculus. 3. Colonic diverticulosis without evidence for diverticulitis. 4. Indeterminate 1 cm hypodensity in the right lobe of the liver, favored as complex cyst or hemangioma. This can be further characterized with MRI if clinically warranted.  Impression and Plan:  Daniel Phelps is a 38 y.o. male with no significant medical problems who presents for evaluation of Abdominal pain , Liver  lesion , intermittent constipation   #Abdominal pain   Patient has upper abdominal pain with relationship with food intake indicating GI origin.  Pain is exacerbated postprandially and relieves by fasting state this could be peptic ulcer disease  This also could be pain from left renal stone as seen on on the CT scan  There could be a competent of referred pain from chronic back issues  Without upper endoscopy I am unable to rule out peptic ulcer disease  I thoroughly discussed with the patient the procedure, including the risks involved. Patient understands what the procedure involves including the benefits and any risks. Patient understands alternatives to the proposed procedure. Risks including (but not limited to) bleeding, tearing of the lining (perforation), rupture of adjacent organs, problems with heart and lung function, infection, and medication reactions. A small percentage of complications may require surgery, hospitalization, repeat endoscopic procedure, and/or transfusion.  Patient understood and agreed.   If above workup is negative and pain is not relieved may refer the patient to evaluate  for gallbladder disease or trigger point injection for abdominal wall pain  #Constipation   Patient intermittently has constipation with Bristol stool scale type I-II.  There could be a component of IBS-C  Will start patient on Metamucil twice daily and Ibgard Ensure adequate fluid intake: Aim for 8 glasses of water daily. Follow a high fiber diet: Include foods such as dates, prunes, pears, and kiwi.  #Liver lesion  CT abdomen pelvis demonstrating either a complex liver cyst or hemangioma.  To better evaluate this lesion MRI hepatic protocol is indicated.  Will obtain MRI abdomen with IV contrast hepatic protocol  All questions were answered.      Vista Lawman, MD Gastroenterology and Hepatology [facility] Gastroenterology   This chart has been completed using  [facility] Dictation software, and while attempts have been made to ensure accuracy , certain words and phrases may not be transcribed as intended

## 2023-11-18 NOTE — Patient Instructions (Signed)
 It was very nice to meet you today, as dicussed with will plan for the following :  1) Upper endoscopy  2) MRI Abdomen  3) Ensure adequate fluid intake: Aim for 8 glasses of water daily. Follow a high fiber diet: Include foods such as dates, prunes, pears, and kiwi. Use Metamucil twice a day. IB Guard

## 2023-11-20 NOTE — Telephone Encounter (Signed)
 Email received from Rad MD that they were needing specific result of prior imaging noting liver lesion for follow up. Pt had CT abd/pelvis in ER on 10/19/23. I have submitted the results of that CT to Rad MD. Await decision

## 2023-11-23 ENCOUNTER — Telehealth (INDEPENDENT_AMBULATORY_CARE_PROVIDER_SITE_OTHER): Payer: Self-pay | Admitting: Gastroenterology

## 2023-11-23 NOTE — Telephone Encounter (Signed)
 Pt called in and states he has came down with a cold and needs to reschedule EGD. Pt rescheduled to 12/07/23 at 1:45pm. New instructions will be mailed to pt.

## 2023-12-01 NOTE — Telephone Encounter (Signed)
 PA approved via Rad MD for MRI. Tracking number [id number] Valid dates 11/18/23-12/18/23. Pt scheduled for 12/09/23 at 9:30am. Pt to arrive at 9:15am to be checked in. Left message for pt to return call to give appt.

## 2023-12-01 NOTE — Telephone Encounter (Signed)
 Pt returned call and verbalized understanding

## 2023-12-02 ENCOUNTER — Telehealth (INDEPENDENT_AMBULATORY_CARE_PROVIDER_SITE_OTHER): Payer: Self-pay | Admitting: Gastroenterology

## 2023-12-02 NOTE — Telephone Encounter (Signed)
 Adrian Alba, RN  Rozann Cornell, LPN Good morning. Can you help me. On 4/28, we have a CRNA staffing issue. The CRNA has to be done by 1pm. We need for Miley to move to the 1100 slot. Can you help me try to get in touch with him? We left him a message yesterday but have not talked to him. Can you ask him to arrive at 0930 for an 1100 case?  Message sent to pt via my chart

## 2023-12-02 NOTE — Telephone Encounter (Signed)
 Left message to return call

## 2023-12-07 ENCOUNTER — Ambulatory Visit (HOSPITAL_COMMUNITY): Admitting: Certified Registered"

## 2023-12-07 ENCOUNTER — Encounter (HOSPITAL_COMMUNITY): Payer: Self-pay | Admitting: Gastroenterology

## 2023-12-07 ENCOUNTER — Ambulatory Visit (HOSPITAL_BASED_OUTPATIENT_CLINIC_OR_DEPARTMENT_OTHER): Admitting: Certified Registered"

## 2023-12-07 ENCOUNTER — Encounter (HOSPITAL_COMMUNITY)
Admission: RE | Disposition: A | Discharge status: Home / Self Care | Payer: Self-pay | Source: Home / Self Care | Attending: Gastroenterology

## 2023-12-07 ENCOUNTER — Other Ambulatory Visit: Payer: Self-pay

## 2023-12-07 ENCOUNTER — Ambulatory Visit (HOSPITAL_COMMUNITY)
Admission: RE | Admit: 2023-12-07 | Discharge: 2023-12-07 | Disposition: A | Discharge status: Home / Self Care | Attending: Gastroenterology | Admitting: Gastroenterology

## 2023-12-07 DIAGNOSIS — D1803 Hemangioma of intra-abdominal structures: Secondary | ICD-10-CM | POA: Insufficient documentation

## 2023-12-07 DIAGNOSIS — K5900 Constipation, unspecified: Secondary | ICD-10-CM | POA: Insufficient documentation

## 2023-12-07 DIAGNOSIS — K3189 Other diseases of stomach and duodenum: Secondary | ICD-10-CM | POA: Diagnosis not present

## 2023-12-07 DIAGNOSIS — K319 Disease of stomach and duodenum, unspecified: Secondary | ICD-10-CM | POA: Diagnosis not present

## 2023-12-07 DIAGNOSIS — R1013 Epigastric pain: Secondary | ICD-10-CM | POA: Diagnosis present

## 2023-12-07 DIAGNOSIS — K2970 Gastritis, unspecified, without bleeding: Secondary | ICD-10-CM

## 2023-12-07 HISTORY — PX: ESOPHAGOGASTRODUODENOSCOPY: SHX5428

## 2023-12-07 SURGERY — EGD (ESOPHAGOGASTRODUODENOSCOPY)
Anesthesia: General

## 2023-12-07 MED ORDER — LIDOCAINE 2% (20 MG/ML) 5 ML SYRINGE
INTRAMUSCULAR | Status: DC | PRN
  Administered 2023-12-07: 100 mg via INTRAVENOUS

## 2023-12-07 MED ORDER — PROPOFOL 10 MG/ML IV BOLUS
INTRAVENOUS | Status: DC | PRN
  Administered 2023-12-07 (×2): 50 mg via INTRAVENOUS
  Administered 2023-12-07: 100 mg via INTRAVENOUS

## 2023-12-07 MED ORDER — DEXMEDETOMIDINE HCL IN NACL 80 MCG/20ML IV SOLN
INTRAVENOUS | Status: DC | PRN
  Administered 2023-12-07: 12 ug via INTRAVENOUS
  Administered 2023-12-07: 8 ug via INTRAVENOUS

## 2023-12-07 MED ORDER — PROPOFOL 500 MG/50ML IV EMUL
INTRAVENOUS | Status: DC | PRN
Start: 2023-12-07 — End: 2023-12-07
  Administered 2023-12-07: 250 ug/kg/min via INTRAVENOUS

## 2023-12-07 MED ORDER — LACTATED RINGERS IV SOLN: INTRAVENOUS | Status: DC | PRN

## 2023-12-07 NOTE — Transfer of Care (Signed)
 Immediate Anesthesia Transfer of Care Note  Patient: Olean Berkshire  Procedure(s) Performed: EGD (ESOPHAGOGASTRODUODENOSCOPY)  Patient Location: Endoscopy Unit  Anesthesia Type:General  Level of Consciousness: drowsy  Airway & Oxygen Therapy: Patient Spontanous Breathing  Post-op Assessment: Report given to RN and Post -op Vital signs reviewed and stable  Post vital signs: Reviewed and stable  Last Vitals:  Vitals Value Taken Time  BP    Temp    Pulse    Resp    SpO2      Last Pain:  Vitals:   12/07/23 0947  TempSrc:   PainSc: 2       Patients Stated Pain Goal: 7 (12/07/23 0929)  Complications: No notable events documented.

## 2023-12-07 NOTE — Anesthesia Procedure Notes (Signed)
 Date/Time: 12/07/2023 9:46 AM  Performed by: Sherwin Donate, CRNAPre-anesthesia Checklist: Patient identified, Emergency Drugs available, Suction available and Patient being monitored Patient Re-evaluated:Patient Re-evaluated prior to induction Oxygen Delivery Method: Nasal cannula Induction Type: IV induction Placement Confirmation: positive ETCO2 Comments: Optiflow High Flow [place] O2 used.

## 2023-12-07 NOTE — Anesthesia Preprocedure Evaluation (Signed)
 Anesthesia Evaluation  Patient identified by MRN, date of birth, ID band Patient awake    Reviewed: Allergy & Precautions, H&P , NPO status , Patient's Chart, lab work & pertinent test results  Airway Mallampati: I  TM Distance: >3 FB Neck ROM: Full    Dental  (+) Teeth Intact, Dental Advisory Given, Caps   Pulmonary neg pulmonary ROS   Pulmonary exam normal breath sounds clear to auscultation       Cardiovascular negative cardio ROS Normal cardiovascular exam Rhythm:Regular Rate:Normal     Neuro/Psych  PSYCHIATRIC DISORDERS Anxiety Depression    negative neurological ROS     GI/Hepatic ,GERD  ,,(+)     substance abuse  alcohol usePt reports quit 2 weeks ago but endorses heavy 5x/wk ETOH abuse.   Endo/Other  negative endocrine ROS    Renal/GU negative Renal ROS     Musculoskeletal negative musculoskeletal ROS (+)    Abdominal Normal abdominal exam  (+)   Peds  Hematology negative hematology ROS (+)   Anesthesia Other Findings Right front tooth capped  Reproductive/Obstetrics                             Anesthesia Physical Anesthesia Plan  ASA: 1  Anesthesia Plan: General   Post-op Pain Management:    Induction: Intravenous  PONV Risk Score and Plan: Propofol  infusion  Airway Management Planned: Natural Airway and Nasal Cannula  Additional Equipment: None  Intra-op Plan:   Post-operative Plan:   Informed Consent: I have reviewed the patients History and Physical, chart, labs and discussed the procedure including the risks, benefits and alternatives for the proposed anesthesia with the patient or authorized representative who has indicated his/her understanding and acceptance.     Dental advisory given  Plan Discussed with: CRNA  Anesthesia Plan Comments:         Anesthesia Quick Evaluation

## 2023-12-07 NOTE — Interval H&P Note (Signed)
 History and Physical Interval Note:  12/07/2023 9:43 AM  Daniel Phelps  has presented today for surgery, with the diagnosis of Epigastric Pain.  The various methods of treatment have been discussed with the patient and family. After consideration of risks, benefits and other options for treatment, the patient has consented to  Procedure(s) with comments: EGD (ESOPHAGOGASTRODUODENOSCOPY) (N/A) - 12:45pm;asa 1-2, LM to moved pt up as a surgical intervention.  The patient's history has been reviewed, patient examined, no change in status, stable for surgery.  I have reviewed the patient's chart and labs.  Questions were answered to the patient's satisfaction.     Forbes Ida Pilar Corrales

## 2023-12-07 NOTE — Anesthesia Postprocedure Evaluation (Signed)
 Anesthesia Post Note  Patient: Daniel Phelps  Procedure(s) Performed: EGD (ESOPHAGOGASTRODUODENOSCOPY)  Patient location during evaluation: PACU Anesthesia Type: General Level of consciousness: awake and alert Pain management: pain level controlled Vital Signs Assessment: post-procedure vital signs reviewed and stable Respiratory status: spontaneous breathing, nonlabored ventilation, respiratory function stable and patient connected to nasal cannula oxygen Cardiovascular status: blood pressure returned to baseline and stable Postop Assessment: no apparent nausea or vomiting Anesthetic complications: no   There were no known notable events for this encounter.   Last Vitals:  Vitals:   12/07/23 0929 12/07/23 1000  BP: (!) 131/90 105/67  Pulse: (!) 55 70  Resp: 13 18  Temp: 36.5 C 36.6 C  SpO2: 98% 97%    Last Pain:  Vitals:   12/07/23 1000  TempSrc: Oral  PainSc: 0-No pain                 Asra Gambrel L Penelope Fittro

## 2023-12-07 NOTE — Discharge Instructions (Signed)

## 2023-12-07 NOTE — Op Note (Signed)
 [facility] Patient Name: Daniel Phelps Procedure Date: 12/07/2023 9:30 AM MRN: [id number] Date of Birth: 08-24-1985 Attending MD: Terril Fetters , MD, [id number] CSN: [id number] Age: 38 Admit Type: Outpatient Procedure:                Upper GI endoscopy Indications:              Epigastric abdominal pain Providers:                Terril Fetters, MD, Crystal Page, Kristine L. Roberta Chin, Technician Referring MD:              Medicines:                Monitored Anesthesia Care Complications:            No immediate complications. Estimated Blood Loss:     Estimated blood loss: none. Procedure:                Pre-Anesthesia Assessment:                           - Prior to the procedure, a History and Physical                            was performed, and patient medications and                            allergies were reviewed. The patient's tolerance of                            previous anesthesia was also reviewed. The risks                            and benefits of the procedure and the sedation                            options and risks were discussed with the patient.                            All questions were answered, and informed consent                            was obtained. Prior Anticoagulants: The patient has                            taken no anticoagulant or antiplatelet agents. ASA                            Grade Assessment: II - A patient with mild systemic                            disease. After reviewing the risks and benefits,  the patient was deemed in satisfactory condition to                            undergo the procedure.                           After obtaining informed consent, the endoscope was                            passed under direct vision. Throughout the                            procedure, the patient's blood pressure, pulse, and                            oxygen saturations were  monitored continuously. The                            [id number]-[id number] ([id number]) scope was introduced through the                            mouth, and advanced to the second part of duodenum.                            The upper GI endoscopy was accomplished without                            difficulty. The patient tolerated the procedure                            well. Scope In: 9:52:55 AM Scope Out: 9:56:35 AM Total Procedure Duration: 0 hours 3 minutes 40 seconds  Findings:      The examined esophagus was normal.      Mildly erythematous mucosa without bleeding was found in the gastric       antrum. Biopsies were taken with a cold forceps for histology.      The duodenal bulb and second portion of the duodenum were normal. Impression:               - Normal esophagus.                           - Erythematous mucosa in the antrum. Biopsied.                           - Normal duodenal bulb and second portion of the                            duodenum. Moderate Sedation:      Per Anesthesia Care Recommendation:           - Patient has a contact number available for                            emergencies. The signs and symptoms of potential  delayed complications were discussed with the                            patient. Return to normal activities tomorrow.                            Written discharge instructions were provided to the                            patient.                           - Resume previous diet.                           - Continue present medications.                           - Await pathology results.                           -Canabinoid cessation which can explain abdominal                            pain Procedure Code(s):        --- Professional ---                           [phone], Esophagogastroduodenoscopy, flexible,                            transoral; with biopsy, single or multiple Diagnosis Code(s):        --- Professional  ---                           K31.89, Other diseases of stomach and duodenum                           R10.13, Epigastric pain CPT copyright 2022 American Medical Association. All rights reserved. The codes documented in this report are preliminary and upon coder review may  be revised to meet current compliance requirements. Terril Fetters, MD Terril Fetters, MD 12/07/2023 10:00:04 AM This report has been signed electronically. Number of Addenda: 0

## 2023-12-08 ENCOUNTER — Encounter (HOSPITAL_COMMUNITY): Payer: Self-pay | Admitting: Gastroenterology

## 2023-12-08 LAB — SURGICAL PATHOLOGY

## 2023-12-09 ENCOUNTER — Ambulatory Visit (HOSPITAL_COMMUNITY)
Admission: RE | Admit: 2023-12-09 | Discharge: 2023-12-09 | Disposition: A | Discharge status: Home / Self Care | Source: Ambulatory Visit | Attending: Gastroenterology | Admitting: Gastroenterology

## 2023-12-09 DIAGNOSIS — R1012 Left upper quadrant pain: Secondary | ICD-10-CM | POA: Diagnosis present

## 2023-12-09 DIAGNOSIS — K769 Liver disease, unspecified: Secondary | ICD-10-CM | POA: Insufficient documentation

## 2023-12-09 MED ORDER — GADOBUTROL 1 MMOL/ML IV SOLN
10.0000 mL | Freq: Once | INTRAVENOUS | Status: AC | PRN
  Administered 2023-12-09: 10 mL via INTRAVENOUS

## 2023-12-15 ENCOUNTER — Encounter (INDEPENDENT_AMBULATORY_CARE_PROVIDER_SITE_OTHER): Payer: Self-pay | Admitting: *Deleted

## 2023-12-16 ENCOUNTER — Encounter (INDEPENDENT_AMBULATORY_CARE_PROVIDER_SITE_OTHER): Payer: Self-pay | Admitting: Gastroenterology

## 2023-12-17 NOTE — Telephone Encounter (Signed)
 Daniel Phelps, please see the patient complaints below. I have attached the MR Abd,EGD, and last office note, and advise. Thanks  12/09/2023: MR abd IMPRESSION: 1. No MR findings of the abdomen to explain left upper quadrant pain. 2. Small, benign hemangioma of the anterior liver dome, hepatic segment VIII. No further follow-up or characterization is required.  12/07/2023 EGD Impression:               - Normal esophagus.                           - Erythematous mucosa in the antrum. Biopsied.                           - Normal duodenal bulb and second portion of the                            duodenum.   11/18/2023:  It was very nice to meet you today, as dicussed with will plan for the following :   1) Upper endoscopy   2) MRI Abdomen   3) Ensure adequate fluid intake: Aim for 8 glasses of water daily. Follow a high fiber diet: Include foods such as dates, prunes, pears, and kiwi. Use Metamucil twice a day. IB Guard

## 2023-12-17 NOTE — Telephone Encounter (Signed)
 I spoke with the patient and made him aware we had an opening tomorrow with Shana Daring, PAC. Patient says he has miss too much work and did not want to come in for this appointment. I did make him aware that if he has worsening symptoms to please report to the Ed. Patient states understanding.

## 2023-12-17 NOTE — Telephone Encounter (Signed)
 I called and left a vm, asked that the patient please return call to the office.

## 2023-12-22 ENCOUNTER — Ambulatory Visit (INDEPENDENT_AMBULATORY_CARE_PROVIDER_SITE_OTHER): Payer: Self-pay | Admitting: Gastroenterology

## 2023-12-22 NOTE — Progress Notes (Signed)
 MRI Abdomen  IMPRESSION: 1. No MR findings of the abdomen to explain left upper quadrant pain. 2. Small, benign hemangioma of the anterior liver dome, hepatic segment VIII. No further follow-up or characterization is required.

## 2023-12-25 ENCOUNTER — Encounter (INDEPENDENT_AMBULATORY_CARE_PROVIDER_SITE_OTHER): Payer: Self-pay

## 2024-01-07 NOTE — Progress Notes (Unsigned)
 Referring Provider: Cook, Jayce G, DO Primary Care Physician:  Cook, Jayce G, DO Primary GI Physician: Dr. Alita Irwin  Chief Complaint  Patient presents with   Colonoscopy    Colonoscopy screening. Has issues with constipation and still having pains in stomach     HPI:   Daniel Phelps is a 38 y.o. male presenting today for follow-up of upper abdominal pain, constipation.   Last seen in the office by Dr. Alita Irwin 11/18/23 reporting 2 years of intermittent LUQ abdominal pain that was worsening, radiating to epigastric and RUQ. Worsened by food intake and relieved with fasting state. Also with bristol 1-2 stools every 1-2 days. Recommended EGD, Metamucil twice daily and Ibgard, adequate water and fiber intake. Also noted liver lesion on prior CT and recommended MRI.   EGD 12/07/23: - Normal esophagus.  - Erythematous mucosa in the antrum. Biopsied.  - Normal duodenal bulb and second portion of the duodenum. - Path with reactive gastropathy, no H pylori.   MRI abd w/w/o contrast 12/09/23 with small benign hemangioma in the anterior liver dome.  No further follow-up or characterization needed.   Today:  Reports he is having upper abdominal pain where his transverse colon is.  States the pain in the left upper quadrant has been present since his 51s, but over the last 6 months, symptoms have been persistent and worsening, now in the right upper quadrant constantly as well, and radiates down his abdomen at times.  Symptoms are worsened by meals, but states it is just like 1 big pulse of pain that radiates down to the bottom of his stomach, to the left, and up towards his chest.  States he can only manage the pain by eating once a day.  When he eats, he will sometimes feel a little heart palpitation.   When he wakes up, he does have some issues with chest discomfort that goes to upper abdomen then down to lower abdomen, then back to upper abdomen where it stays the rest of the day.  He describes the  constant pain in the upper abdomen as a constant aggravation and soreness.  No regular heartburn.  Some belching.  Some gas.  The more he passes, the better he feels.   Constipation started in the last 6 months ago as well.  Has [place] 1-2 stools.  Reports having an acute general black stool.  No rectal bleeding.  Denies iron or Pepto.  Hasn't started metamucil or ibguard.  He is not taking pantoprazole .  States he took pantoprazole  for several months previously, but had no improvement in his symptoms. Also states he is leery of medications.    After patient laid on exam table, has not showed me again where he feels this pain.  He points on top of the lower left and right rib cage.  States he does notice pain in these areas when exercising and doing something like a pull-up.  He is very concerned there is something wrong in his colon.   Past Medical History:  Diagnosis Date   Anxiety    Chronic back pain    Depression    GERD (gastroesophageal reflux disease)    Substance abuse (HCC)     Past Surgical History:  Procedure Laterality Date   ESOPHAGOGASTRODUODENOSCOPY N/A 12/07/2023   Procedure: EGD (ESOPHAGOGASTRODUODENOSCOPY);  Surgeon: Hargis Lias, MD;  Location: [facility];  Service: Endoscopy;  Laterality: N/A;  12:45pm;asa 1-2, LM to moved pt up   HERNIA REPAIR  LUMBAR LAMINECTOMY/DECOMPRESSION MICRODISCECTOMY N/A 07/04/2014   Procedure: LUMBAR LAMINECTOMY/DECOMPRESSION MICRODISCECTOMY 1 LEVEL,LEFT LUMBAR FIVE-SACRAL ONE;  Surgeon: Audie Bleacher, MD;  Location: [facility] OR;  Service: Neurosurgery;  Laterality: N/A;  left   SPINE SURGERY  07/04/2014   Left L5 semihemilaminectomy and L5/S1 discetomy     Current Outpatient Medications  Medication Sig Dispense Refill   pantoprazole  (PROTONIX ) 20 MG tablet Take 1 tablet (20 mg total) by mouth daily. (Patient not taking: Reported on 01/08/2024) 30 tablet 0   No current facility-administered medications for this visit.     Allergies as of 01/08/2024 - Review Complete 01/08/2024  Allergen Reaction Noted   Ibuprofen Other (See Comments) 07/03/2014    Family History  Problem Relation Age of Onset   Other Mother    Hernia Father    Diabetes Maternal Grandmother    Diabetes Maternal Grandfather    Liver disease Neg Hx    Esophageal cancer Neg Hx    Colon cancer Neg Hx     Social History   Socioeconomic History   Marital status: Single    Spouse name: Not on file   Number of children: 3   Years of education: Not on file   Highest education level: Not on file  Occupational History   Occupation: self employe  Tobacco Use   Smoking status: Never   Smokeless tobacco: Never  Vaping Use   Vaping status: Never Used  Substance and Sexual Activity   Alcohol use: Not Currently    Alcohol/week: 6.0 standard drinks of alcohol    Types: 6 Shots of liquor per week    Comment: last drink 2 days ago. usually drinks 3 days a week.   Drug use: Yes    Types: Marijuana    Comment: last used yesterday   Sexual activity: Not Currently  Other Topics Concern   Not on file  Social History Narrative   Not on file   Social Drivers of Health   Financial Resource Strain: Not on file  Food Insecurity: Not on file  Transportation Needs: Not on file  Physical Activity: Unknown (02/24/2018)   Exercise Vital Sign    Days of Exercise per Week: 0 days    Minutes of Exercise per Session: Not on file  Stress: Not on file  Social Connections: Not on file    Review of Systems: Gen: Denies fever, chills, cold or flulike symptoms, presyncope, syncope. CV: Denies exertional chest pain. Resp: Denies dyspnea at rest, cough. GI: See HPI Heme: See HPI  Physical Exam: BP 126/79 (BP Location: Right Arm, Patient Position: Sitting, Cuff Size: Large)   Pulse 73   Temp 97.9 F (36.6 C) (Temporal)   Ht 6\' 2"  (1.88 m)   Wt 221 lb 3.2 oz (100.3 kg)   BMI 28.40 kg/m  General:   Alert and oriented. No distress noted.  Pleasant and cooperative.  Head:  Normocephalic and atraumatic. Eyes:  Conjuctiva clear without scleral icterus. Heart:  S1, S2 present without murmurs appreciated. Lungs:  Clear to auscultation bilaterally. No wheezes, rales, or rhonchi. No distress.  Chest wall: No reproducible pain with palpation of the anterior ribs. Abdomen:  +BS, soft, non-tender and non-distended. No rebound or guarding. No HSM or masses noted. Msk:  Symmetrical without gross deformities. Normal posture. Extremities:  Without edema. Neurologic:  Alert and  oriented x4 Psych:  Normal mood and affect.    Assessment:  38 year old male with history of anxiety, depression, substance abuse in remission, presenting today for further  evaluation of upper abdominal pain, constipation.  Upper abdominal pain/rib pain: Chronic history of LUQ/left rib pain dating back to his 22s with worsening symptoms over the last 6 months, now with pain in RUQ/right rib area, radiating down to lower abdomen and up to his chest intermittently. Pain is described as constant aggravation and soreness that worsens postprandially.  Extensive evaluation thus far. Labs in March (CBC, CMP, lipase) unremarkable. CT A/P with contrast, CT angio chest was unrevealing in March 2025.  MRI abdomen with and without contrast April 2025 also unrevealing.  He does have a small liver hemangioma, but this is an incidental finding. EGD in April 2025 showing mild reactive gastropathy.  Reports no change with PPI trial.   Patient is very concerned that there is something wrong with his colon.  In the setting of worsening constipation over the last 6 months, I suspect that constipation may very well be the driving factor behind his abdominal pain.  May have component of IBS.  Notably, he does report improvement the more gas he is able to pass which may be driven by uncontrolled constipation. Less likely that he has IBD or colon cancer, but will arrange colonoscopy to be sure.  Will also start MiraLAX  for constipation.    If symptoms continue despite improving bowel function and colonoscopy is unrevealing, would consider workup for biliary dyskinesia.    Note, he may also have MSK etiology as when patient is on exam table and points to the area of his pain, it is in the lower rib cage on both his left and right side and he also notes some worsening of symptoms when doing pull-ups.    Constipation: New onset in the last 6 months. Will start MiraLAX  daily and arrange colonoscopy to evaluate change in bowel habits and abdominal pain as per above.    Plan:  Proceed with colonoscopy with propofol  by Dr. Alita Irwin in the near future. The risks, benefits, and alternatives have been discussed with the patient in detail. The patient states understanding and desires to proceed.  ASA 2 Start MiraLAX  17 g daily. Follow-up after procedure.    Shana Daring, PA-C [facility] 01/08/2024

## 2024-01-08 ENCOUNTER — Telehealth: Payer: Self-pay | Admitting: *Deleted

## 2024-01-08 ENCOUNTER — Encounter: Payer: Self-pay | Admitting: *Deleted

## 2024-01-08 ENCOUNTER — Ambulatory Visit: Admitting: Gastroenterology

## 2024-01-08 ENCOUNTER — Encounter: Payer: Self-pay | Admitting: Gastroenterology

## 2024-01-08 VITALS — BP 126/79 | HR 73 | Temp 97.9°F | Ht 74.0 in | Wt 221.2 lb

## 2024-01-08 DIAGNOSIS — R1010 Upper abdominal pain, unspecified: Secondary | ICD-10-CM

## 2024-01-08 DIAGNOSIS — K5900 Constipation, unspecified: Secondary | ICD-10-CM | POA: Diagnosis not present

## 2024-01-08 DIAGNOSIS — R0781 Pleurodynia: Secondary | ICD-10-CM

## 2024-01-08 NOTE — Telephone Encounter (Signed)
 LMOVM to return call  TCS w/Dr.Ahmed, asa 2

## 2024-01-08 NOTE — Patient Instructions (Signed)
 We will get you scheduled for a colonoscopy in the near future with Dr. Alita Irwin.  To help with constipation, please start MiraLAX  1 capful (17 g) daily in 8 ounces of water or other noncarbonated beverage of your choice.  We will see you back in the office after your procedure.  Shana Daring, PA-C [facility]

## 2024-01-11 ENCOUNTER — Encounter (INDEPENDENT_AMBULATORY_CARE_PROVIDER_SITE_OTHER): Payer: Self-pay | Admitting: *Deleted

## 2024-01-11 ENCOUNTER — Encounter (INDEPENDENT_AMBULATORY_CARE_PROVIDER_SITE_OTHER): Payer: Self-pay | Admitting: Gastroenterology

## 2024-01-11 ENCOUNTER — Other Ambulatory Visit (INDEPENDENT_AMBULATORY_CARE_PROVIDER_SITE_OTHER): Payer: Self-pay | Admitting: *Deleted

## 2024-01-11 MED ORDER — PEG 3350-KCL-NA BICARB-NACL 420 G PO SOLR: 4000.0000 mL | Freq: Once | ORAL | 0 refills | Status: DC

## 2024-02-03 ENCOUNTER — Encounter (HOSPITAL_COMMUNITY)
Admission: RE | Disposition: A | Discharge status: Home / Self Care | Payer: Self-pay | Source: Home / Self Care | Attending: Gastroenterology

## 2024-02-03 ENCOUNTER — Telehealth: Payer: Self-pay | Admitting: *Deleted

## 2024-02-03 ENCOUNTER — Encounter (HOSPITAL_COMMUNITY): Payer: Self-pay | Admitting: Anesthesiology

## 2024-02-03 ENCOUNTER — Ambulatory Visit (HOSPITAL_COMMUNITY)
Admission: RE | Admit: 2024-02-03 | Discharge: 2024-02-03 | Disposition: A | Discharge status: Home / Self Care | Attending: Gastroenterology | Admitting: Gastroenterology

## 2024-02-03 ENCOUNTER — Encounter: Payer: Self-pay | Admitting: *Deleted

## 2024-02-03 ENCOUNTER — Other Ambulatory Visit: Payer: Self-pay | Admitting: *Deleted

## 2024-02-03 ENCOUNTER — Ambulatory Visit: Payer: Self-pay | Admitting: Family Medicine

## 2024-02-03 DIAGNOSIS — Z5309 Procedure and treatment not carried out because of other contraindication: Secondary | ICD-10-CM | POA: Diagnosis not present

## 2024-02-03 DIAGNOSIS — R109 Unspecified abdominal pain: Secondary | ICD-10-CM | POA: Diagnosis not present

## 2024-02-03 DIAGNOSIS — R194 Change in bowel habit: Secondary | ICD-10-CM | POA: Insufficient documentation

## 2024-02-03 HISTORY — PX: COLONOSCOPY: SHX5424

## 2024-02-03 SURGERY — COLONOSCOPY
Anesthesia: Choice

## 2024-02-03 MED ORDER — LACTATED RINGERS IV SOLN: INTRAVENOUS | Status: DC

## 2024-02-03 MED ORDER — PEG 3350-KCL-NA BICARB-NACL 420 G PO SOLR: 4000.0000 mL | Freq: Once | ORAL | 0 refills | Status: AC

## 2024-02-03 NOTE — Telephone Encounter (Signed)
-----   Message from Deatrice JULIANNA Dine sent at 02/03/2024 12:35 PM EDT ----- Regarding: reschedule Hi Daniel Phelps   Patient presented for colonoscopy today . Did not receive his prep .   Can you reschedule the procedure and get in touch with him to go over intsructions again please

## 2024-02-03 NOTE — Telephone Encounter (Signed)
 Pt has been rescheduled for 03/07/24. Updated instructions mailed and sent via mychart. Prep sent to pharmacy and told pt to pick it up

## 2024-02-03 NOTE — Telephone Encounter (Signed)
 FYI Only or Action Required?: Action required by provider: request for appointment.  Patient was last seen in primary care on 10/30/2023 by Cook, Jayce G, DO. Called Nurse Triage reporting Chest Pain. Symptoms began several months ago. Symptoms are: gradually worsening.  Triage Disposition: Call EMS 911 Now  Patient/caregiver understands and will follow disposition?: No, wishes to speak with PCP                     Copied from CRM [id number]. Topic: Clinical - Red Word Triage >> Feb 03, 2024  3:59 PM Charlet HERO wrote: Red Word that prompted transfer to Nurse Triage: Patient is having soreness and pain under his heart on the left and then to the center of his chest. He is stating that he has been to ER and he wants to get referral to a cardiologist. Reason for Disposition  [1] Chest pain lasts > 5 minutes AND [2] age > 30 AND [3] one or more cardiac risk factors (e.g., diabetes, high blood pressure, high cholesterol, smoker, or strong family history of heart disease)  Answer Assessment - Initial Assessment Questions This RN recommends ED but patient refused and wants to schedule an appointment. This RN notified [facility] of patient refusal.    LOCATION: Where does it hurt?       Left side of chest and the center RADIATION: Does the pain go anywhere else? (e.g., into neck, jaw, arms, back)     Right side of ribcage ONSET: When did the chest pain begin? (Minutes, hours or days)      After fasting two days for colonoscopy worsened PATTERN: Does the pain come and go, or has it been constant since it started?  Does it get worse with exertion?      Constant for several months SEVERITY: How bad is the pain?  (e.g., Scale 1-10; mild, moderate, or severe)    - MILD (1-3): doesn't interfere with normal activities     - MODERATE (4-7): interferes with normal activities or awakens from sleep    - SEVERE (8-10): excruciating pain, unable to do any normal activities       5/10  pain level CARDIAC RISK FACTORS: Do you have any history of heart problems or risk factors for heart disease? (e.g., angina, prior heart attack; diabetes, high blood pressure, high cholesterol, smoker, or strong family history of heart disease)     Grandpa had a heart attack and high blood pressure PULMONARY RISK FACTORS: Do you have any history of lung disease?  (e.g., blood clots in lung, asthma, emphysema, birth control pills)     No CAUSE: What do you think is causing the chest pain?     I have no idea; been to ED for it 1-2 months ago OTHER SYMPTOMS: Do you have any other symptoms? (e.g., dizziness, nausea, vomiting, sweating, fever, difficulty breathing, cough)       Dizziness in head from not eating for colonoscopy  Protocols used: Chest Pain-A-AH

## 2024-02-03 NOTE — H&P (Signed)
 Patient did not prep for the colonoscopy . Did not drink bowel prep . Will reschedule with instructions again

## 2024-02-03 NOTE — Telephone Encounter (Signed)
 Called [facility] and spoke with tech. Rx was never picked up.   Called pt, LMOVM to reschedule procedure

## 2024-02-03 NOTE — OR Nursing (Signed)
 Patient informed this nurse that he had taken 4 dulcolax tablets and simethicone. Pt reports not eating solid food since Monday at 12pm. Pt reported his last bowel movement to be clear. Dr. Cinderella informed and aware. Spoke with patient and informed patient he needed to be rescheduled due to not drinking Trilyte  prep prior to procedure. Pt verbalized understanding. Dr. Cinderella will be in contact with his office to get him rescheduled.

## 2024-02-03 NOTE — OR Nursing (Signed)
 Lawyer Yager was at [facility] on 02/03/2024 for a procedure. He can return to work 02/03/2024 at 1300.

## 2024-02-04 ENCOUNTER — Encounter (HOSPITAL_COMMUNITY): Payer: Self-pay | Admitting: Gastroenterology

## 2024-02-04 ENCOUNTER — Ambulatory Visit: Admitting: Family Medicine

## 2024-02-04 VITALS — BP 124/85 | HR 62 | Temp 98.4°F | Ht 74.0 in | Wt 216.0 lb

## 2024-02-04 DIAGNOSIS — R109 Unspecified abdominal pain: Secondary | ICD-10-CM | POA: Diagnosis not present

## 2024-02-04 DIAGNOSIS — G8929 Other chronic pain: Secondary | ICD-10-CM | POA: Diagnosis not present

## 2024-02-04 MED ORDER — PANTOPRAZOLE SODIUM 40 MG PO TBEC: 40.0000 mg | DELAYED_RELEASE_TABLET | Freq: Two times a day (BID) | ORAL | 0 refills | Status: DC

## 2024-02-04 NOTE — Assessment & Plan Note (Signed)
 Patient placed back on Protonix .  Being aggressive with 40 mg twice daily.  Advised that he should reach out to GI.  We had a lengthy discussion today about his symptoms.  I do not feel like this is organic in nature especially in the setting of extensive workup.

## 2024-02-04 NOTE — Progress Notes (Signed)
 Subjective:  Patient ID: Daniel Phelps, male    DOB: 07-15-1986  Age: 38 y.o. MRN: [id number]  CC:   Chief Complaint  Patient presents with   Abdominal Pain    Upper abdomen pain radiates across upper abdomen left to right, is sore and numb   Neck is sore and numb Did not eat for 2 days for colonoscopy     HPI:  38 year old male presents for evaluation of the above.  This is a chronic issue for the patient.  He continues to have upper abdominal pain/chest wall pain.  He states that it spans the entire upper abdomen -left upper quadrant, epigastric region, and right upper quadrant.  He reports areas of tenderness near the ribs on the medial aspect.  Recently had an endoscopy.  Was supposed to have colonoscopy but did not take the prep.  Patient states that his pain is worse with fasting.  He also states that it is worse if he eats more than 1 time a day.  He states that he has no pain when he eats 1 healthy meal a day.  Patient states that he feels like he is dying.  He feels like he is worsening.  He states that he is now having pain radiate up his chest on both sides and go to his neck.  He also reports numbness in the region of the upper abdomen.  Patient has had extensive workup regarding his symptoms: Chest x-ray, abdominal ultrasound, CT abdomen and pelvis as well as CT angio of the chest, endoscopy, and MRI abdomen.  Has had negative laboratory workup as well.  Patient Active Problem List   Diagnosis Date Noted   Liver lesion 11/18/2023   Chronic idiopathic constipation 11/18/2023   Chronic abdominal pain 11/02/2023   Chronic chest wall pain 10/21/2022   GERD (gastroesophageal reflux disease) 09/22/2022   Schizophrenia spectrum disorder with psychotic disorder type not yet determined (HCC) 09/11/2022   Chronic back pain 01/29/2018    Social Hx   Social History   Socioeconomic History   Marital status: Single    Spouse name: Not on file   Number of children: 3   Years of  education: Not on file   Highest education level: Not on file  Occupational History   Occupation: self employe  Tobacco Use   Smoking status: Never   Smokeless tobacco: Never  Vaping Use   Vaping status: Never Used  Substance and Sexual Activity   Alcohol use: Not Currently    Alcohol/week: 6.0 standard drinks of alcohol    Types: 6 Shots of liquor per week    Comment: last drink 2 days ago. usually drinks 3 days a week.   Drug use: Yes    Types: Marijuana    Comment: last used yesterday   Sexual activity: Not Currently  Other Topics Concern   Not on file  Social History Narrative   Not on file   Social Drivers of Health   Financial Resource Strain: Not on file  Food Insecurity: Not on file  Transportation Needs: Not on file  Physical Activity: Unknown (02/24/2018)   Exercise Vital Sign    Days of Exercise per Week: 0 days    Minutes of Exercise per Session: Not on file  Stress: Not on file  Social Connections: Not on file    Review of Systems Per HPI  Objective:  BP 124/85   Pulse 62   Temp 98.4 F (36.9 C)   Ht 6'  2 (1.88 m)   Wt 216 lb (98 kg)   SpO2 97%   BMI 27.73 kg/m      02/04/2024    8:38 AM 01/08/2024    8:05 AM 12/07/2023   10:00 AM  BP/Weight  Systolic BP 124 126 105  Diastolic BP 85 79 67  Wt. (Lbs) 216 221.2   BMI 27.73 kg/m2 28.4 kg/m2     Physical Exam Vitals and nursing note reviewed.  Constitutional:      General: He is not in acute distress.    Appearance: Normal appearance.  HENT:     Head: Normocephalic and atraumatic.   Cardiovascular:     Rate and Rhythm: Normal rate and regular rhythm.  Pulmonary:     Effort: Pulmonary effort is normal.     Breath sounds: Normal breath sounds. No wheezing.  Abdominal:     General: There is no distension.     Palpations: Abdomen is soft.     Comments: No significant tenderness on exam.   Neurological:     Mental Status: He is alert.   Psychiatric:     Comments: Flat affect.      Lab Results  Component Value Date   WBC 5.6 10/19/2023   HGB 14.4 10/19/2023   HCT 43.3 10/19/2023   PLT 253 10/19/2023   GLUCOSE 119 (H) 10/19/2023   CHOL 149 01/10/2022   TRIG 195 (H) 01/10/2022   HDL 34 (L) 01/10/2022   LDLCALC 82 01/10/2022   ALT 21 10/19/2023   AST 18 10/19/2023   NA 138 10/19/2023   K 3.5 10/19/2023   CL 103 10/19/2023   CREATININE 0.89 10/19/2023   BUN 17 10/19/2023   CO2 26 10/19/2023   TSH 0.899 10/13/2022   HGBA1C 5.3 01/10/2022     Assessment & Plan:  Chronic abdominal pain Assessment & Plan: Patient placed back on Protonix .  Being aggressive with 40 mg twice daily.  Advised that he should reach out to GI.  We had a lengthy discussion today about his symptoms.  I do not feel like this is organic in nature especially in the setting of extensive workup.   Other orders -     Pantoprazole  Sodium; Take 1 tablet (40 mg total) by mouth 2 (two) times daily before a meal.  Dispense: 180 tablet; Refill: 0    Follow-up: Advised to call GI for further recommendations  Ramonia Mcclaran Bluford DO [facility]

## 2024-02-04 NOTE — Patient Instructions (Addendum)
 Take the medication twice daily before eating.  Call Dr. Orion office for an appt for re-evaluation.  Dr. Bluford

## 2024-02-07 ENCOUNTER — Emergency Department (HOSPITAL_COMMUNITY)
Admission: EM | Admit: 2024-02-07 | Discharge: 2024-02-07 | Disposition: A | Discharge status: Home / Self Care | Attending: Emergency Medicine | Admitting: Emergency Medicine

## 2024-02-07 ENCOUNTER — Emergency Department (HOSPITAL_COMMUNITY)

## 2024-02-07 ENCOUNTER — Other Ambulatory Visit: Payer: Self-pay

## 2024-02-07 ENCOUNTER — Encounter (HOSPITAL_COMMUNITY): Payer: Self-pay | Admitting: Emergency Medicine

## 2024-02-07 DIAGNOSIS — R0789 Other chest pain: Secondary | ICD-10-CM | POA: Diagnosis present

## 2024-02-07 DIAGNOSIS — M542 Cervicalgia: Secondary | ICD-10-CM | POA: Diagnosis not present

## 2024-02-07 DIAGNOSIS — E876 Hypokalemia: Secondary | ICD-10-CM | POA: Diagnosis not present

## 2024-02-07 LAB — CBC WITH DIFFERENTIAL/PLATELET
Abs Immature Granulocytes: 0.01 10*3/uL (ref 0.00–0.07)
Basophils Absolute: 0 10*3/uL (ref 0.0–0.1)
Basophils Relative: 1 %
Eosinophils Absolute: 0.2 10*3/uL (ref 0.0–0.5)
Eosinophils Relative: 4 %
HCT: 43.6 % (ref 39.0–52.0)
Hemoglobin: 14.8 g/dL (ref 13.0–17.0)
Immature Granulocytes: 0 %
Lymphocytes Relative: 34 %
Lymphs Abs: 1.9 10*3/uL (ref 0.7–4.0)
MCH: 30.3 pg (ref 26.0–34.0)
MCHC: 33.9 g/dL (ref 30.0–36.0)
MCV: 89.2 fL (ref 80.0–100.0)
Monocytes Absolute: 0.4 10*3/uL (ref 0.1–1.0)
Monocytes Relative: 7 %
Neutro Abs: 3 10*3/uL (ref 1.7–7.7)
Neutrophils Relative %: 54 %
Platelets: 233 10*3/uL (ref 150–400)
RBC: 4.89 MIL/uL (ref 4.22–5.81)
RDW: 11.6 % (ref 11.5–15.5)
WBC: 5.5 10*3/uL (ref 4.0–10.5)
nRBC: 0 % (ref 0.0–0.2)

## 2024-02-07 LAB — COMPREHENSIVE METABOLIC PANEL WITH GFR
ALT: 30 U/L (ref 0–44)
AST: 17 U/L (ref 15–41)
Albumin: 4.2 g/dL (ref 3.5–5.0)
Alkaline Phosphatase: 53 U/L (ref 38–126)
Anion gap: 10 (ref 5–15)
BUN: 20 mg/dL (ref 6–20)
CO2: 25 mmol/L (ref 22–32)
Calcium: 9.1 mg/dL (ref 8.9–10.3)
Chloride: 106 mmol/L (ref 98–111)
Creatinine, Ser: 1.13 mg/dL (ref 0.61–1.24)
GFR, Estimated: >60 mL/min (ref >60)
Glucose, Bld: 108 mg/dL — ABNORMAL HIGH (ref 70–99)
Potassium: 3.8 mmol/L (ref 3.5–5.1)
Sodium: 141 mmol/L (ref 135–145)
Total Bilirubin: 0.6 mg/dL (ref 0.0–1.2)
Total Protein: 7.1 g/dL (ref 6.5–8.1)

## 2024-02-07 LAB — TROPONIN I (HIGH SENSITIVITY)
Troponin I (High Sensitivity): 3 ng/L (ref <18)
Troponin I (High Sensitivity): 3 ng/L (ref <18)

## 2024-02-07 LAB — LIPASE, BLOOD: Lipase: 33 U/L (ref 11–51)

## 2024-02-07 LAB — D-DIMER, QUANTITATIVE: D-Dimer, Quant: <0.27 ug{FEU}/mL (ref 0.00–0.50)

## 2024-02-07 MED ORDER — POTASSIUM CHLORIDE CRYS ER 20 MEQ PO TBCR
40.0000 meq | EXTENDED_RELEASE_TABLET | Freq: Once | ORAL | Status: DC
  Filled 2024-02-07: qty 2

## 2024-02-07 NOTE — ED Triage Notes (Signed)
 Pt reports left sided chest pain and neck pain for 6 months that has worsened. States he has had CT scan of abd but requesting CT for head and neck. Endorses SOB, intermittent nausea. Pain relieved with rest.

## 2024-02-07 NOTE — ED Provider Notes (Signed)
 Daniel Phelps   CSN: [id number] Arrival date & time: 02/07/24  9044     Patient presents with: Neck Pain and Chest Pain   Daniel Phelps is a 38 y.o. male.  {Add pertinent medical, surgical, social history, OB history to YEP:67052} Patient complains of periodic lower chest discomfort.  He has been evaluated by GI and his family doctor.  He had a CT of his chest and abdomen and March and an MRI of his abdomen in April.   Neck Pain Associated symptoms: chest pain   Chest Pain      Prior to Admission medications   Medication Sig Start Date End Date Taking? Authorizing Provider  pantoprazole  (PROTONIX ) 40 MG tablet Take 1 tablet (40 mg total) by mouth 2 (two) times daily before a meal. 02/04/24   Cook, Jayce G, DO    Allergies: Ibuprofen    Review of Systems  Cardiovascular:  Positive for chest pain.  Musculoskeletal:  Positive for neck pain.    Updated Vital Signs BP 118/82   Pulse 67   Temp 98.5 F (36.9 C) (Oral)   Resp 14   Ht 6' 2 (1.88 m)   Wt 98 kg   SpO2 98%   BMI 27.73 kg/m   Physical Exam  (all labs ordered are listed, but only abnormal results are displayed) Labs Reviewed  COMPREHENSIVE METABOLIC PANEL WITH GFR - Abnormal; Notable for the following components:      Result Value   Glucose, Bld 108 (*)    All other components within normal limits  CBC WITH DIFFERENTIAL/PLATELET  LIPASE, BLOOD  D-DIMER, QUANTITATIVE  TROPONIN I (HIGH SENSITIVITY)  TROPONIN I (HIGH SENSITIVITY)    EKG: EKG Interpretation Date/Time:  Sunday February 07 2024 10:03:48 EDT Ventricular Rate:  70 PR Interval:  169 QRS Duration:  94 QT Interval:  387 QTC Calculation: 418 R Axis:   85  Text Interpretation: Sinus arrhythmia ST elevation early repolarization Reconfirmed by Suzette Pac [phone]) on 02/07/2024 12:38:36 PM  Radiology: DG Chest 2 View Result Date: 02/07/2024 CLINICAL DATA:  Pain. EXAM: CHEST  - 2 VIEW COMPARISON:  10/19/2023 FINDINGS: Heart size and mediastinal contours appear normal. There is no sign of pleural effusion, interstitial edema or airspace disease. Visualized osseous structures are unremarkable. IMPRESSION: No acute cardiopulmonary disease. Electronically Signed   By: Waddell Calk M.D.   On: 02/07/2024 10:51    {Document cardiac monitor, telemetry assessment procedure when appropriate:32947} Procedures   Medications Ordered in the ED  potassium chloride  SA (KLOR-CON  M) CR tablet 40 mEq (has no administration in time range)   EKG shows some repolarization.  Potassium slightly low.   {Click here for ABCD2, HEART and other calculators REFRESH Phelps before signing:1}                              Medical Decision Making Amount and/or Complexity of Data Reviewed Labs: ordered. Radiology: ordered.  Risk Prescription drug management.   Patient with atypical chest pain.  He was encouraged to take his Protonix  and has been referred to cardiology  {Document critical care time when appropriate  Document review of labs and clinical decision tools ie CHADS2VASC2, etc  Document your independent review of radiology images and any outside records  Document your discussion with family members, caretakers and with consultants  Document social determinants of health affecting pt's care  Document your  decision making why or why not admission, treatments were needed:32947:::1}   Final diagnoses:  Atypical chest pain    ED Discharge Orders          Ordered    Ambulatory referral to Cardiology       Comments: If you have not heard from the Cardiology office within the next 72 hours please call [phone].   02/07/24 1250

## 2024-02-07 NOTE — Discharge Instructions (Addendum)
 Call to make an appointment to follow-up with Dr. Alvan or one of his colleagues in the next couple weeks.  Please start back taking your Protonix 

## 2024-02-17 ENCOUNTER — Encounter (INDEPENDENT_AMBULATORY_CARE_PROVIDER_SITE_OTHER): Payer: Self-pay | Admitting: Gastroenterology

## 2024-02-17 ENCOUNTER — Ambulatory Visit (INDEPENDENT_AMBULATORY_CARE_PROVIDER_SITE_OTHER): Admitting: Gastroenterology

## 2024-02-17 VITALS — BP 106/73 | HR 92 | Temp 98.0°F | Ht 74.0 in | Wt 218.3 lb

## 2024-02-17 DIAGNOSIS — R1010 Upper abdominal pain, unspecified: Secondary | ICD-10-CM | POA: Insufficient documentation

## 2024-02-17 DIAGNOSIS — K5900 Constipation, unspecified: Secondary | ICD-10-CM

## 2024-02-17 DIAGNOSIS — R1012 Left upper quadrant pain: Secondary | ICD-10-CM | POA: Diagnosis not present

## 2024-02-17 DIAGNOSIS — D1803 Hemangioma of intra-abdominal structures: Secondary | ICD-10-CM | POA: Diagnosis not present

## 2024-02-17 DIAGNOSIS — R0789 Other chest pain: Secondary | ICD-10-CM

## 2024-02-17 HISTORY — DX: Other chest pain: R07.89

## 2024-02-17 HISTORY — DX: Constipation, unspecified: K59.00

## 2024-02-17 HISTORY — DX: Upper abdominal pain, unspecified: R10.10

## 2024-02-17 NOTE — Patient Instructions (Signed)
 It was very nice to meet you today, as dicussed with will plan for the following :  1) Low FODMAP diet  2) IB Guard 1-2 times daily  3) Colonoscopy   4) avoid sugars

## 2024-02-17 NOTE — H&P (View-Only) (Signed)
 Ladean Steinmeyer Faizan Aubery Date , M.D. Gastroenterology & Hepatology [facility] [address] [place], [place] [address] Primary Care Physician: Bluford Jacqulyn MATSU, DO [address] [place] [address]  Chief Complaint:   Daniel Phelps chest and left upper abdominal pain  History of Present Illness: Daniel Phelps is a 38 y.o. male with anxiety who presents for evaluation of  Sharp chest and left upper abdominal pain  Patient is presenting today for evaluation of intermittent abdominal discomfort.  Patient reports yesterday he ate 6 small mini donuts and after that started having left upper abdomen and chest discomfort.  Patient has vague symptoms such as mentioning his brain would swell up if he fast.  Mostly pointing scattered chest sharp pains no exacerbating or relieving factors.  Patient denies any hematochezia, dysphagia  EGD 12/07/23: - Normal esophagus.  - Erythematous mucosa in the antrum. Biopsied.  - Normal duodenal bulb and second portion of the duodenum. - Path with reactive gastropathy, no H pylori.    MRI abd w/w/o contrast 12/09/23 with small benign hemangioma in the anterior liver dome.  No further follow-up or characterization needed.  Past Medical History: Past Medical History:  Diagnosis Date   Anxiety    Chronic back pain    Depression    GERD (gastroesophageal reflux disease)    Substance abuse (HCC)     Past Surgical History: Past Surgical History:  Procedure Laterality Date   COLONOSCOPY N/A 02/03/2024   Procedure: COLONOSCOPY;  Surgeon: Cinderella Deatrice FALCON, MD;  [facility];  Service: Endoscopy;  Laterality: N/A;  2:00 pm, asa 2   ESOPHAGOGASTRODUODENOSCOPY N/A 12/07/2023   Procedure: EGD (ESOPHAGOGASTRODUODENOSCOPY);  Surgeon: Cinderella Deatrice FALCON, MD;  [facility];  Service: Endoscopy;  Laterality: N/A;  12:45pm;asa 1-2, LM to moved pt up   HERNIA REPAIR     LUMBAR LAMINECTOMY/DECOMPRESSION  MICRODISCECTOMY N/A 07/04/2014   Procedure: LUMBAR LAMINECTOMY/DECOMPRESSION MICRODISCECTOMY 1 LEVEL,LEFT LUMBAR FIVE-SACRAL ONE;  Surgeon: Rockey Peru, MD;  Location: [facility];  Service: Neurosurgery;  Laterality: N/A;  left   SPINE SURGERY  07/04/2014   Left L5 semihemilaminectomy and L5/S1 discetomy     Family History: Family History  Problem Relation Age of Onset   Other Mother    Hernia Father    Diabetes Maternal Grandmother    Diabetes Maternal Grandfather    Liver disease Neg Hx    Esophageal cancer Neg Hx    Colon cancer Neg Hx     Social History: Social History   Tobacco Use  Smoking Status Never  Smokeless Tobacco Never   Social History   Substance and Sexual Activity  Alcohol Use Not Currently   Alcohol/week: 6.0 standard drinks of alcohol   Types: 6 Shots of liquor per week   Comment: last drink 2 days ago. usually drinks 3 days a week.   Social History   Substance and Sexual Activity  Drug Use Yes   Types: Marijuana   Comment: last used yesterday    Allergies: Allergies  Allergen Reactions   Ibuprofen Other (See Comments)    Sends pain down spine    Medications: Current Outpatient Medications  Medication Sig Dispense Refill   pantoprazole  (PROTONIX ) 40 MG tablet Take 1 tablet (40 mg total) by mouth 2 (two) times daily before a meal. (Patient not taking: Reported on 02/17/2024) 180 tablet 0   No current facility-administered medications for this visit.    Review of Systems: GENERAL: negative for malaise,  night sweats HEENT: No changes in hearing or vision, no nose bleeds or other nasal problems. NECK: Negative for lumps, goiter, pain and significant neck swelling RESPIRATORY: Negative for cough, wheezing CARDIOVASCULAR: Negative for chest pain, leg swelling, palpitations, orthopnea GI: SEE HPI MUSCULOSKELETAL: Negative for joint pain or swelling, back pain, and muscle pain. SKIN: Negative for lesions, rash HEMATOLOGY Negative for prolonged  bleeding, bruising easily, and swollen nodes. ENDOCRINE: Negative for cold or heat intolerance, polyuria, polydipsia and goiter. NEURO: negative for tremor, gait imbalance, syncope and seizures. The remainder of the review of systems is noncontributory.   Physical Exam: BP 106/73   Pulse 92   Temp 98 F (36.7 C)   Ht 6' 2 (1.88 m)   Wt 218 lb 4.8 oz (99 kg)   BMI 28.03 kg/m  GENERAL: The patient is AO x3, in no acute distress. HEENT: Head is normocephalic and atraumatic. EOMI are intact. Mouth is well hydrated and without lesions. NECK: Supple. No masses LUNGS: Clear to auscultation. No presence of rhonchi/wheezing/rales. Adequate chest expansion HEART: RRR, normal s1 and s2. ABDOMEN: Soft, nontender, no guarding, no peritoneal signs, and nondistended. BS +. No masses.   Imaging/Labs: as above     Latest Ref Rng & Units 02/07/2024   10:28 AM 10/19/2023    4:12 PM 10/15/2023   10:18 AM  CBC  WBC 4.0 - 10.5 K/uL 5.5  5.6  4.9   Hemoglobin 13.0 - 17.0 g/dL 85.1  85.5  86.3   Hematocrit 39.0 - 52.0 % 43.6  43.3  41.0   Platelets 150 - 400 K/uL 233  253  208    No results found for: IRON, TIBC, FERRITIN  I personally reviewed and interpreted the available labs, imaging and endoscopic files.   MRI Abdomen  IMPRESSION: 1. No MR findings of the abdomen to explain left upper quadrant pain. 2. Small, benign hemangioma of the anterior liver dome, hepatic segment VIII. No further follow-up or characterization is required.     Impression and Plan: Daniel Phelps is a 38 y.o. male with anxiety who presents for evaluation of  Sharp chest and left upper abdominal pain  #Abominal discomfort    Labs in March (CBC, CMP, lipase) unremarkable. CT A/P with contrast, CT angio chest was unrevealing in March 2025.  MRI abdomen with and without contrast April 2025 also unrevealing.  He does have a small liver hemangioma, but this is an incidental finding. EGD in April 2025 showing mild  reactive gastropathy.    So far patient had extensive workup not explaining his symptoms.  Patient was scheduled for colonoscopy previously but did not prep and hence was rescheduled  I have a suspicion of carbohydrate intolerance as patient does have abdominal pain after carbohydrate ingestion.  This  could be IBS unspecified type.  Patient symptoms also appear to be functional but would benefit from cardiology evaluation given persistent chest pains patient is complaining about  Recommendation  Patient was reassured today about workup so far which remain negative Low FODMAP diet IBgard 1-2 times daily Would avoid Shogry food especially with added sugar and sweeteners  All questions were answered.      Almarie Kurdziel Faizan Vicky Mccanless, MD Gastroenterology and Hepatology [facility]   This chart has been completed using [facility] Dictation software, and while attempts have been made to ensure accuracy , certain words and phrases may not be transcribed as intended

## 2024-02-17 NOTE — Progress Notes (Signed)
 Ladean Steinmeyer Daniel Aubery Date , M.D. Gastroenterology & Hepatology [facility]enter[facility] [address] [place], [place] [address] Primary Care Physician: Bluford Jacqulyn MATSU, DO [address] [place] [address]  Chief Complaint:   [name] chest and left upper abdominal pain  History of Present Illness: Daniel Phelps is a 38 y.o. male with anxiety who presents for evaluation of  Sharp chest and left upper abdominal pain  Patient is presenting today for evaluation of intermittent abdominal discomfort.  Patient reports yesterday he ate 6 small mini donuts and after that started having left upper abdomen and chest discomfort.  Patient has vague symptoms such as mentioning his brain would swell up if he fast.  Mostly pointing scattered chest sharp pains no exacerbating or relieving factors.  Patient denies any hematochezia, dysphagia  EGD 12/07/23: - Normal esophagus.  - Erythematous mucosa in the antrum. Biopsied.  - Normal duodenal bulb and second portion of the duodenum. - Path with reactive gastropathy, no H pylori.    MRI abd w/w/o contrast 12/09/23 with small benign hemangioma in the anterior liver dome.  No further follow-up or characterization needed.  Past Medical History: Past Medical History:  Diagnosis Date   Anxiety    Chronic back pain    Depression    GERD (gastroesophageal reflux disease)    Substance abuse (HCC)     Past Surgical History: Past Surgical History:  Procedure Laterality Date   COLONOSCOPY N/A 02/03/2024   Procedure: COLONOSCOPY;  Surgeon: Cinderella Deatrice FALCON, MD;  [facility];  Service: Endoscopy;  Laterality: N/A;  2:00 pm, asa 2   ESOPHAGOGASTRODUODENOSCOPY N/A 12/07/2023   Procedure: EGD (ESOPHAGOGASTRODUODENOSCOPY);  Surgeon: Cinderella Deatrice FALCON, MD;  [facility];  Service: Endoscopy;  Laterality: N/A;  12:45pm;asa 1-2, LM to moved pt up   HERNIA REPAIR     LUMBAR LAMINECTOMY/DECOMPRESSION  MICRODISCECTOMY N/A 07/04/2014   Procedure: LUMBAR LAMINECTOMY/DECOMPRESSION MICRODISCECTOMY 1 LEVEL,LEFT LUMBAR FIVE-SACRAL ONE;  Surgeon: Rockey Peru, MD;  Location: [facility];  Service: Neurosurgery;  Laterality: N/A;  left   SPINE SURGERY  07/04/2014   Left L5 semihemilaminectomy and L5/S1 discetomy     Family History: Family History  Problem Relation Age of Onset   Other Mother    Hernia Father    Diabetes Maternal Grandmother    Diabetes Maternal Grandfather    Liver disease Neg Hx    Esophageal cancer Neg Hx    Colon cancer Neg Hx     Social History: Social History   Tobacco Use  Smoking Status Never  Smokeless Tobacco Never   Social History   Substance and Sexual Activity  Alcohol Use Not Currently   Alcohol/week: 6.0 standard drinks of alcohol   Types: 6 Shots of liquor per week   Comment: last drink 2 days ago. usually drinks 3 days a week.   Social History   Substance and Sexual Activity  Drug Use Yes   Types: Marijuana   Comment: last used yesterday    Allergies: Allergies  Allergen Reactions   Ibuprofen Other (See Comments)    Sends pain down spine    Medications: Current Outpatient Medications  Medication Sig Dispense Refill   pantoprazole  (PROTONIX ) 40 MG tablet Take 1 tablet (40 mg total) by mouth 2 (two) times daily before a meal. (Patient not taking: Reported on 02/17/2024) 180 tablet 0   No current facility-administered medications for this visit.    Review of Systems: GENERAL: negative for malaise,  night sweats HEENT: No changes in hearing or vision, no nose bleeds or other nasal problems. NECK: Negative for lumps, goiter, pain and significant neck swelling RESPIRATORY: Negative for cough, wheezing CARDIOVASCULAR: Negative for chest pain, leg swelling, palpitations, orthopnea GI: SEE HPI MUSCULOSKELETAL: Negative for joint pain or swelling, back pain, and muscle pain. SKIN: Negative for lesions, rash HEMATOLOGY Negative for prolonged  bleeding, bruising easily, and swollen nodes. ENDOCRINE: Negative for cold or heat intolerance, polyuria, polydipsia and goiter. NEURO: negative for tremor, gait imbalance, syncope and seizures. The remainder of the review of systems is noncontributory.   Physical Exam: BP 106/73   Pulse 92   Temp 98 F (36.7 C)   Ht 6' 2 (1.88 m)   Wt 218 lb 4.8 oz (99 kg)   BMI 28.03 kg/m  GENERAL: The patient is AO x3, in no acute distress. HEENT: Head is normocephalic and atraumatic. EOMI are intact. Mouth is well hydrated and without lesions. NECK: Supple. No masses LUNGS: Clear to auscultation. No presence of rhonchi/wheezing/rales. Adequate chest expansion HEART: RRR, normal s1 and s2. ABDOMEN: Soft, nontender, no guarding, no peritoneal signs, and nondistended. BS +. No masses.   Imaging/Labs: as above     Latest Ref Rng & Units 02/07/2024   10:28 AM 10/19/2023    4:12 PM 10/15/2023   10:18 AM  CBC  WBC 4.0 - 10.5 K/uL 5.5  5.6  4.9   Hemoglobin 13.0 - 17.0 g/dL 85.1  85.5  86.3   Hematocrit 39.0 - 52.0 % 43.6  43.3  41.0   Platelets 150 - 400 K/uL 233  253  208    No results found for: IRON, TIBC, FERRITIN  I personally reviewed and interpreted the available labs, imaging and endoscopic files.   MRI Abdomen  IMPRESSION: 1. No MR findings of the abdomen to explain left upper quadrant pain. 2. Small, benign hemangioma of the anterior liver dome, hepatic segment VIII. No further follow-up or characterization is required.     Impression and Plan: Daniel Phelps is a 38 y.o. male with anxiety who presents for evaluation of  Sharp chest and left upper abdominal pain  #Abominal discomfort    Labs in March (CBC, CMP, lipase) unremarkable. CT A/P with contrast, CT angio chest was unrevealing in March 2025.  MRI abdomen with and without contrast April 2025 also unrevealing.  He does have a small liver hemangioma, but this is an incidental finding. EGD in April 2025 showing mild  reactive gastropathy.    So far patient had extensive workup not explaining his symptoms.  Patient was scheduled for colonoscopy previously but did not prep and hence was rescheduled  I have a suspicion of carbohydrate intolerance as patient does have abdominal pain after carbohydrate ingestion.  This  could be IBS unspecified type.  Patient symptoms also appear to be functional but would benefit from cardiology evaluation given persistent chest pains patient is complaining about  Recommendation  Patient was reassured today about workup so far which remain negative Low FODMAP diet IBgard 1-2 times daily Would avoid Shogry food especially with added sugar and sweeteners  All questions were answered.      Daniel Phelps Daniel Vicky Mccanless, MD Gastroenterology and Hepatology [facility]   This chart has been completed using [facility] Dictation software, and while attempts have been made to ensure accuracy , certain words and phrases may not be transcribed as intended

## 2024-02-24 NOTE — Progress Notes (Unsigned)
 Referring Provider: Cook, Jayce G, DO Primary Care Physician:  Cook, Jayce G, DO Primary GI Physician: Dr. Cinderella  No chief complaint on file.   HPI:   Daniel Phelps is a 38 y.o. male presenting today to discuss further evaluation of upper abdominal pain.   Patient was initially seen in our office in April 2025 for evaluation of abdominal pain and constipation.  He has described chronic history of LUQ/left rib pain dating back to his 60s with worsening symptoms since the beginning of the year, now also having RUQ/right rib pain radiating down to his lower abdomen and up to his chest with symptoms worsen postprandially.  Evaluation this year has included: CBC, CMP, lipase unremarkable.  CT angio chest PE 10/19/2023 with no acute findings.  CT A/P with contrast 10/19/2023 with no acute findings.  Nonobstructing left renal calculus.  Colonic diverticulosis without diverticulitis.  Indeterminate 1 cm hypodensity in the right lobe of the liver.  EGD 12/07/23: - Normal esophagus.  - Erythematous mucosa in the antrum. Biopsied.  - Normal duodenal bulb and second portion of the duodenum. - Path with reactive gastropathy, no H pylori.    MRI abd w/w/o contrast 12/09/23 with small benign hemangioma in the anterior liver dome.  No further follow-up or characterization needed. Gallbladder and biliary tree unremarkable.   He has been scheduled for colonoscopy planned for 03/07/2024.  Patient saw Dr. Cinderella on 02/17/2024.  He was reporting sharp chest and left upper abdominal pain, started after eating 6 many donuts.  Suspected carbohydrate intolerance as patient did note pain after consumption of carbohydrates.  Also queried IBS, functional pain, but would benefit from cardiology evaluation given persistent chest pain.  Recommended low FODMAP diet, IBgard 1-3 times daily, avoid sugary foods with added sugar and sweeteners.   Today:    Past Medical History:  Diagnosis Date   Anxiety    Chronic back  pain    Depression    GERD (gastroesophageal reflux disease)    Substance abuse (HCC)     Past Surgical History:  Procedure Laterality Date   COLONOSCOPY N/A 02/03/2024   Procedure: COLONOSCOPY;  Surgeon: Cinderella Deatrice FALCON, MD;  [facility];  Service: Endoscopy;  Laterality: N/A;  2:00 pm, asa 2   ESOPHAGOGASTRODUODENOSCOPY N/A 12/07/2023   Procedure: EGD (ESOPHAGOGASTRODUODENOSCOPY);  Surgeon: Cinderella Deatrice FALCON, MD;  [facility]: [facility];  Service: Endoscopy;  Laterality: N/A;  12:45pm;asa 1-2, LM to moved pt up   HERNIA REPAIR     LUMBAR LAMINECTOMY/DECOMPRESSION MICRODISCECTOMY N/A 07/04/2014   Procedure: LUMBAR LAMINECTOMY/DECOMPRESSION MICRODISCECTOMY 1 LEVEL,LEFT LUMBAR FIVE-SACRAL ONE;  Surgeon: Rockey Peru, MD;  Location: [facility];  Service: Neurosurgery;  Laterality: N/A;  left   SPINE SURGERY  07/04/2014   Left L5 semihemilaminectomy and L5/S1 discetomy     Current Outpatient Medications  Medication Sig Dispense Refill   pantoprazole  (PROTONIX ) 40 MG tablet Take 1 tablet (40 mg total) by mouth 2 (two) times daily before a meal. (Patient not taking: Reported on 02/17/2024) 180 tablet 0   No current facility-administered medications for this visit.    Allergies as of 02/25/2024 - Review Complete 02/17/2024  Allergen Reaction Noted   Ibuprofen Other (See Comments) 07/03/2014    Family History  Problem Relation Age of Onset   Other Mother    Hernia Father    Diabetes Maternal Grandmother    Diabetes Maternal Grandfather    Liver disease Neg Hx    Esophageal cancer Neg Hx  Colon cancer Neg Hx     Social History   Socioeconomic History   Marital status: Single    Spouse name: Not on file   Number of children: 3   Years of education: Not on file   Highest education level: Not on file  Occupational History   Occupation: self employe  Tobacco Use   Smoking status: Never   Smokeless tobacco: Never  Vaping Use   Vaping status: Never Used  Substance  and Sexual Activity   Alcohol use: Not Currently    Alcohol/week: 6.0 standard drinks of alcohol    Types: 6 Shots of liquor per week    Comment: last drink 2 days ago. usually drinks 3 days a week.   Drug use: Yes    Types: Marijuana    Comment: last used yesterday   Sexual activity: Not Currently  Other Topics Concern   Not on file  Social History Narrative   Not on file   Social Drivers of Health   Financial Resource Strain: Not on file  Food Insecurity: Not on file  Transportation Needs: Not on file  Physical Activity: Unknown (02/24/2018)   Exercise Vital Sign    Days of Exercise per Week: 0 days    Minutes of Exercise per Session: Not on file  Stress: Not on file  Social Connections: Not on file    Review of Systems: Gen: Denies fever, chills, anorexia. Denies fatigue, weakness, weight loss.  CV: Denies chest pain, palpitations, syncope, peripheral edema, and claudication. Resp: Denies dyspnea at rest, cough, wheezing, coughing up blood, and pleurisy. GI: Denies vomiting blood, jaundice, and fecal incontinence.   Denies dysphagia or odynophagia. Derm: Denies rash, itching, dry skin Psych: Denies depression, anxiety, memory loss, confusion. No homicidal or suicidal ideation.  Heme: Denies bruising, bleeding, and enlarged lymph nodes.  Physical Exam: There were no vitals taken for this visit. General:   Alert and oriented. No distress noted. Pleasant and cooperative.  Head:  Normocephalic and atraumatic. Eyes:  Conjuctiva clear without scleral icterus. Heart:  S1, S2 present without murmurs appreciated. Lungs:  Clear to auscultation bilaterally. No wheezes, rales, or rhonchi. No distress.  Abdomen:  +BS, soft, non-tender and non-distended. No rebound or guarding. No HSM or masses noted. Msk:  Symmetrical without gross deformities. Normal posture. Extremities:  Without edema. Neurologic:  Alert and  oriented x4 Psych:  Normal mood and affect.    Assessment:      Plan:  ***   Josette Centers, PA-C [facility] 02/25/2024

## 2024-02-25 ENCOUNTER — Encounter: Payer: Self-pay | Admitting: *Deleted

## 2024-02-25 ENCOUNTER — Ambulatory Visit (INDEPENDENT_AMBULATORY_CARE_PROVIDER_SITE_OTHER): Admitting: Gastroenterology

## 2024-02-25 ENCOUNTER — Encounter: Payer: Self-pay | Admitting: Gastroenterology

## 2024-02-25 VITALS — BP 131/84 | HR 73 | Temp 97.9°F | Ht 74.0 in | Wt 215.4 lb

## 2024-02-25 DIAGNOSIS — R1010 Upper abdominal pain, unspecified: Secondary | ICD-10-CM

## 2024-02-25 DIAGNOSIS — K5900 Constipation, unspecified: Secondary | ICD-10-CM

## 2024-02-25 DIAGNOSIS — R0789 Other chest pain: Secondary | ICD-10-CM

## 2024-02-25 DIAGNOSIS — R0781 Pleurodynia: Secondary | ICD-10-CM

## 2024-02-25 NOTE — Patient Instructions (Addendum)
 We will arrange for you to have a HIDA scan to evaluate the anatomy of your gallbladder.  Please have blood work completed at [facility].  Start MiraLAX  17g (1 capful) daily in 8 oz of water for constipation.  Keep your colonoscopy appointment as scheduled.  Josette Centers, PA-C [facility]

## 2024-02-26 ENCOUNTER — Encounter: Payer: Self-pay | Admitting: *Deleted

## 2024-03-01 DIAGNOSIS — F1910 Other psychoactive substance abuse, uncomplicated: Secondary | ICD-10-CM | POA: Insufficient documentation

## 2024-03-01 DIAGNOSIS — F32A Depression, unspecified: Secondary | ICD-10-CM | POA: Insufficient documentation

## 2024-03-01 DIAGNOSIS — F419 Anxiety disorder, unspecified: Secondary | ICD-10-CM | POA: Insufficient documentation

## 2024-03-02 ENCOUNTER — Ambulatory Visit: Attending: Cardiology

## 2024-03-02 ENCOUNTER — Encounter: Payer: Self-pay | Admitting: Cardiology

## 2024-03-02 ENCOUNTER — Ambulatory Visit: Attending: Cardiology | Admitting: Cardiology

## 2024-03-02 VITALS — BP 122/80 | HR 75 | Ht 74.0 in | Wt 216.4 lb

## 2024-03-02 DIAGNOSIS — R002 Palpitations: Secondary | ICD-10-CM | POA: Insufficient documentation

## 2024-03-02 DIAGNOSIS — R011 Cardiac murmur, unspecified: Secondary | ICD-10-CM | POA: Insufficient documentation

## 2024-03-02 DIAGNOSIS — R079 Chest pain, unspecified: Secondary | ICD-10-CM | POA: Diagnosis present

## 2024-03-02 DIAGNOSIS — R0789 Other chest pain: Secondary | ICD-10-CM | POA: Diagnosis not present

## 2024-03-02 NOTE — Patient Instructions (Signed)
 Medication Instructions:  Continue same medications *If you need a refill on your cardiac medications before your next appointment, please call your pharmacy*  Lab Work: None ordered  Testing/Procedures: 2D Echo  Stress Echo  2 week Heart Monitor   Follow-Up: At [facility], you and your health needs are our priority.  As part of our continuing mission to provide you with exceptional heart care, our providers are all part of one team.  This team includes your primary Cardiologist (physician) and Advanced Practice Providers or APPs (Physician Assistants and Nurse Practitioners) who all work together to provide you with the care you need, when you need it.  Your next appointment:  9 months    Provider:  Dr.Revankar   We recommend signing up for the patient portal called MyChart.  Sign up information is provided on this After Visit Summary.  MyChart is used to connect with patients for Virtual Visits (Telemedicine).  Patients are able to view lab/test results, encounter notes, upcoming appointments, etc.  Non-urgent messages can be sent to your provider as well.   To learn more about what you can do with MyChart, go to [email or web address].

## 2024-03-02 NOTE — Progress Notes (Signed)
 Cardiology Office Note:    Date:  03/02/2024   ID:  Daniel Phelps, DOB 07/20/1986, MRN [id number]  PCP:  Glennon Sand, NP  Cardiologist:  Jennifer JONELLE Crape, MD   Referring MD: Suzette Pac, MD    ASSESSMENT:    1. Chest wall pain   2. Chest pain of uncertain etiology   3. Palpitations   4. Cardiac murmur    PLAN:    In order of problems listed above:  Primary prevention stressed with the patient.  Importance of compliance with diet medication stressed and patient verbalized standing. Chest pain: Atypical in nature but in view of his concerns we will do an exercise stress echo.  He is agreeable. Cardiac murmur: Echocardiogram will be done to assess murmur heard on auscultation. Palpitations: He is agreeable for a 2-week monitor.  I reassured him about my findings. Patient will be seen in follow-up appointment in 12 months or earlier if the patient has any concerns.    Medication Adjustments/Labs and Tests Ordered: Current medicines are reviewed at length with the patient today.  Concerns regarding medicines are outlined above.  Orders Placed This Encounter  Procedures   EKG 12-Lead   No orders of the defined types were placed in this encounter.    History of Present Illness:    Daniel Phelps is a 38 y.o. male who is being seen today for the evaluation of chest pain at the request of Suzette Pac, MD. patient is a pleasant 38 year old male.  He has past medical history that is not much significant.  He is referred here for chest pain.  He mentions to me.  He has chest pain but this happens on eating certain foods.  Does not happen with exercise or lifting weights.  No orthopnea PND no dizziness.  For this reason he is referred here.  He is very concerned about his symptoms.  At the time of my evaluation, the patient is alert awake oriented and in no distress.  He gives history of palpitations or another flip-flop sensation in his chest.  No dizziness syncope or any such  symptoms.  Past Medical History:  Diagnosis Date   Anxiety    Chest wall pain 02/17/2024   Chronic abdominal pain 11/02/2023   Chronic back pain    Chronic chest wall pain 10/21/2022   Chronic idiopathic constipation 11/18/2023   Constipation 02/17/2024   Depression    GERD (gastroesophageal reflux disease)    Liver lesion 11/18/2023   Schizophrenia spectrum disorder with psychotic disorder type not yet determined (HCC) 09/11/2022   Substance abuse (HCC)    Upper abdominal pain 02/17/2024    Past Surgical History:  Procedure Laterality Date   COLONOSCOPY N/A 02/03/2024   Procedure: COLONOSCOPY;  Surgeon: Cinderella Deatrice FALCON, MD;  [facility];  Service: Endoscopy;  Laterality: N/A;  2:00 pm, asa 2   ESOPHAGOGASTRODUODENOSCOPY N/A 12/07/2023   Procedure: EGD (ESOPHAGOGASTRODUODENOSCOPY);  Surgeon: Cinderella Deatrice FALCON, MD;  [facility];  Service: Endoscopy;  Laterality: N/A;  12:45pm;asa 1-2, LM to moved pt up   HERNIA REPAIR     LUMBAR LAMINECTOMY/DECOMPRESSION MICRODISCECTOMY N/A 07/04/2014   Procedure: LUMBAR LAMINECTOMY/DECOMPRESSION MICRODISCECTOMY 1 LEVEL,LEFT LUMBAR FIVE-SACRAL ONE;  Surgeon: Rockey Peru, MD;  Location: [facility];  Service: Neurosurgery;  Laterality: N/A;  left   SPINE SURGERY  07/04/2014   Left L5 semihemilaminectomy and L5/S1 discetomy     Current Medications: No outpatient medications have been marked as taking for the 03/02/24  encounter (Office Visit) with Alissia Lory, Jennifer SAUNDERS, MD.     Allergies:   Ibuprofen   Social History   Socioeconomic History   Marital status: Single    Spouse name: Not on file   Number of children: 3   Years of education: Not on file   Highest education level: Not on file  Occupational History   Occupation: self employe  Tobacco Use   Smoking status: Never   Smokeless tobacco: Never  Vaping Use   Vaping status: Never Used  Substance and Sexual Activity   Alcohol use: Not Currently    Alcohol/week: 6.0  standard drinks of alcohol    Types: 6 Shots of liquor per week    Comment: last drink 2 days ago. usually drinks 3 days a week.   Drug use: Yes    Types: Marijuana    Comment: last used yesterday   Sexual activity: Not Currently  Other Topics Concern   Not on file  Social History Narrative   Not on file   Social Drivers of Health   Financial Resource Strain: Not on file  Food Insecurity: Not on file  Transportation Needs: Not on file  Physical Activity: Unknown (02/24/2018)   Exercise Vital Sign    Days of Exercise per Week: 0 days    Minutes of Exercise per Session: Not on file  Stress: Not on file  Social Connections: Not on file     Family History: The patient's family history includes Diabetes in his maternal grandfather and maternal grandmother; Hernia in his father; Other in his mother. There is no history of Liver disease, Esophageal cancer, or Colon cancer.  ROS:   Please see the history of present illness.    All other systems reviewed and are negative.  EKGs/Labs/Other Studies Reviewed:    The following studies were reviewed today: Delete EKG Interpretation Date/Time:  Wednesday March 02 2024 09:40:10 EDT Ventricular Rate:  75 PR Interval:  182 QRS Duration:  90 QT Interval:  364 QTC Calculation: 406 R Axis:   83  Text Interpretation: Normal sinus rhythm Early repolarization Normal ECG When compared with ECG of 07-Feb-2024 10:03, PREVIOUS ECG IS PRESENT Confirmed by Edwyna Jennifer [phone]) on 03/02/2024 9:47:41 AM     Recent Labs: 02/07/2024: ALT 30; BUN 20; Creatinine, Ser 1.13; Hemoglobin 14.8; Platelets 233; Potassium 3.8; Sodium 141  Recent Lipid Panel    Component Value Date/Time   CHOL 149 01/10/2022 1508   TRIG 195 (H) 01/10/2022 1508   HDL 34 (L) 01/10/2022 1508   CHOLHDL 4.4 01/10/2022 1508   CHOLHDL 3.4 08/14/2015 1654   VLDL 37 (H) 08/14/2015 1654   LDLCALC 82 01/10/2022 1508    Physical Exam:    VS:  BP 122/80   Pulse 75   Ht 6' 2  (1.88 m)   Wt 216 lb 6.4 oz (98.2 kg)   SpO2 97%   BMI 27.78 kg/m     Wt Readings from Last 3 Encounters:  03/02/24 216 lb 6.4 oz (98.2 kg)  02/25/24 215 lb 6.4 oz (97.7 kg)  02/17/24 218 lb 4.8 oz (99 kg)     GEN: Patient is in no acute distress HEENT: Normal NECK: No JVD; No carotid bruits LYMPHATICS: No lymphadenopathy CARDIAC: S1 S2 regular, 2/6 systolic murmur at the apex. RESPIRATORY:  Clear to auscultation without rales, wheezing or rhonchi  ABDOMEN: Soft, non-tender, non-distended MUSCULOSKELETAL:  No edema; No deformity  SKIN: Warm and dry NEUROLOGIC:  Alert and oriented x 3  PSYCHIATRIC:  Normal affect    Signed, Jennifer JONELLE Crape, MD  03/02/2024 9:57 AM    [facility]

## 2024-03-03 ENCOUNTER — Ambulatory Visit (HOSPITAL_COMMUNITY)
Admission: RE | Admit: 2024-03-03 | Discharge: 2024-03-03 | Disposition: A | Discharge status: Home / Self Care | Source: Ambulatory Visit | Attending: Gastroenterology | Admitting: Gastroenterology

## 2024-03-03 ENCOUNTER — Encounter: Payer: Self-pay | Admitting: *Deleted

## 2024-03-03 ENCOUNTER — Encounter (HOSPITAL_COMMUNITY): Payer: Self-pay

## 2024-03-03 DIAGNOSIS — R1010 Upper abdominal pain, unspecified: Secondary | ICD-10-CM | POA: Diagnosis present

## 2024-03-03 MED ORDER — TECHNETIUM TC 99M MEBROFENIN IV KIT
5.0000 | PACK | Freq: Once | INTRAVENOUS | Status: AC | PRN
  Administered 2024-03-03: 5.3 via INTRAVENOUS

## 2024-03-04 LAB — IGA: Immunoglobulin A: 200 mg/dL (ref 47–310)

## 2024-03-04 LAB — TISSUE TRANSGLUTAMINASE, IGA: (tTG) Ab, IgA: <1 U/mL

## 2024-03-06 ENCOUNTER — Ambulatory Visit: Payer: Self-pay | Admitting: Gastroenterology

## 2024-03-07 ENCOUNTER — Encounter (INDEPENDENT_AMBULATORY_CARE_PROVIDER_SITE_OTHER): Payer: Self-pay | Admitting: *Deleted

## 2024-03-07 ENCOUNTER — Encounter (HOSPITAL_COMMUNITY): Payer: Self-pay | Admitting: Gastroenterology

## 2024-03-07 ENCOUNTER — Ambulatory Visit (HOSPITAL_COMMUNITY)
Admission: RE | Admit: 2024-03-07 | Discharge: 2024-03-07 | Disposition: A | Discharge status: Home / Self Care | Attending: Gastroenterology | Admitting: Gastroenterology

## 2024-03-07 ENCOUNTER — Other Ambulatory Visit: Payer: Self-pay

## 2024-03-07 ENCOUNTER — Encounter (HOSPITAL_COMMUNITY)
Admission: RE | Disposition: A | Discharge status: Home / Self Care | Payer: Self-pay | Source: Home / Self Care | Attending: Gastroenterology

## 2024-03-07 ENCOUNTER — Encounter (HOSPITAL_COMMUNITY): Payer: Self-pay | Admitting: Anesthesiology

## 2024-03-07 ENCOUNTER — Ambulatory Visit (HOSPITAL_BASED_OUTPATIENT_CLINIC_OR_DEPARTMENT_OTHER): Payer: Self-pay | Admitting: Anesthesiology

## 2024-03-07 DIAGNOSIS — K649 Unspecified hemorrhoids: Secondary | ICD-10-CM | POA: Diagnosis not present

## 2024-03-07 DIAGNOSIS — R1084 Generalized abdominal pain: Secondary | ICD-10-CM | POA: Diagnosis not present

## 2024-03-07 DIAGNOSIS — F32A Depression, unspecified: Secondary | ICD-10-CM | POA: Diagnosis not present

## 2024-03-07 DIAGNOSIS — R1012 Left upper quadrant pain: Secondary | ICD-10-CM | POA: Diagnosis present

## 2024-03-07 DIAGNOSIS — K5900 Constipation, unspecified: Secondary | ICD-10-CM

## 2024-03-07 DIAGNOSIS — F419 Anxiety disorder, unspecified: Secondary | ICD-10-CM | POA: Insufficient documentation

## 2024-03-07 DIAGNOSIS — K648 Other hemorrhoids: Secondary | ICD-10-CM | POA: Diagnosis not present

## 2024-03-07 DIAGNOSIS — K640 First degree hemorrhoids: Secondary | ICD-10-CM

## 2024-03-07 HISTORY — PX: COLONOSCOPY: SHX5424

## 2024-03-07 LAB — HM COLONOSCOPY

## 2024-03-07 SURGERY — COLONOSCOPY
Anesthesia: General

## 2024-03-07 MED ORDER — PROPOFOL 500 MG/50ML IV EMUL
INTRAVENOUS | Status: DC | PRN
Start: 2024-03-07 — End: 2024-03-07
  Administered 2024-03-07: 100 mg via INTRAVENOUS
  Administered 2024-03-07: 200 ug/kg/min via INTRAVENOUS
  Administered 2024-03-07: 50 mg via INTRAVENOUS

## 2024-03-07 MED ORDER — DICYCLOMINE HCL 20 MG PO TABS: 10.0000 mg | ORAL_TABLET | Freq: Three times a day (TID) | ORAL | 3 refills | Status: DC

## 2024-03-07 MED ORDER — LACTATED RINGERS IV SOLN: INTRAVENOUS | Status: DC

## 2024-03-07 MED ORDER — DEXMEDETOMIDINE HCL IN NACL 80 MCG/20ML IV SOLN
INTRAVENOUS | Status: DC | PRN
  Administered 2024-03-07: 4 ug via INTRAVENOUS
  Administered 2024-03-07: 8 ug via INTRAVENOUS

## 2024-03-07 NOTE — Interval H&P Note (Signed)
 History and Physical Interval Note:  03/07/2024 12:22 PM  Daniel Phelps  has presented today for surgery, with the diagnosis of change in bowels, abd pain.  The various methods of treatment have been discussed with the patient and family. After consideration of risks, benefits and other options for treatment, the patient has consented to  Procedure(s) with comments: COLONOSCOPY (N/A) - 2:00 pm, asa 2 as a surgical intervention.  The patient's history has been reviewed, patient examined, no change in status, stable for surgery.  I have reviewed the patient's chart and labs.  Questions were answered to the patient's satisfaction.     Deatrice FALCON [name]

## 2024-03-07 NOTE — Transfer of Care (Signed)
 Immediate Anesthesia Transfer of Care Note  Patient: Daniel Phelps  Procedure(s) Performed: COLONOSCOPY  Patient Location: Endoscopy Unit  Anesthesia Type:General  Level of Consciousness: drowsy  Airway & Oxygen Therapy: Patient Spontanous Breathing  Post-op Assessment: Report given to RN and Post -op Vital signs reviewed and stable  Post vital signs: Reviewed and stable  Last Vitals:  Vitals Value Taken Time  BP 106/51 03/07/24 12:52  Temp 36.6 C 03/07/24 12:52  Pulse 69 03/07/24 12:52  Resp 20 03/07/24 12:52  SpO2 95 % 03/07/24 12:52    Last Pain:  Vitals:   03/07/24 1256  TempSrc:   PainSc: 0-No pain         Complications: No notable events documented.

## 2024-03-07 NOTE — Discharge Instructions (Signed)

## 2024-03-07 NOTE — Anesthesia Preprocedure Evaluation (Signed)
 Anesthesia Evaluation  Patient identified by MRN, date of birth, ID band Patient awake    Reviewed: Allergy & Precautions, H&P , NPO status , Patient's Chart, lab work & pertinent test results  Airway Mallampati: I  TM Distance: >3 FB Neck ROM: Full    Dental  (+) Teeth Intact, Dental Advisory Given, Caps   Pulmonary neg pulmonary ROS   Pulmonary exam normal breath sounds clear to auscultation       Cardiovascular negative cardio ROS Normal cardiovascular exam Rhythm:Regular Rate:Normal     Neuro/Psych  PSYCHIATRIC DISORDERS Anxiety Depression    negative neurological ROS     GI/Hepatic ,GERD  ,,(+)     substance abuse  alcohol usePt reports quit 2 weeks ago but endorses heavy 5x/wk ETOH abuse.   Endo/Other  negative endocrine ROS    Renal/GU negative Renal ROS     Musculoskeletal negative musculoskeletal ROS (+)    Abdominal Normal abdominal exam  (+)   Peds  Hematology negative hematology ROS (+)   Anesthesia Other Findings Right front tooth capped  Reproductive/Obstetrics                             Anesthesia Physical Anesthesia Plan  ASA: 1  Anesthesia Plan: General   Post-op Pain Management:    Induction: Intravenous  PONV Risk Score and Plan: Propofol  infusion  Airway Management Planned: Natural Airway and Nasal Cannula  Additional Equipment: None  Intra-op Plan:   Post-operative Plan:   Informed Consent: I have reviewed the patients History and Physical, chart, labs and discussed the procedure including the risks, benefits and alternatives for the proposed anesthesia with the patient or authorized representative who has indicated his/her understanding and acceptance.     Dental advisory given  Plan Discussed with: CRNA  Anesthesia Plan Comments:         Anesthesia Quick Evaluation

## 2024-03-07 NOTE — Op Note (Signed)
 [facility] Patient Name: Daniel Phelps Procedure Date: 03/07/2024 12:19 PM MRN: [id number] Date of Birth: 1986-06-08 Attending MD: Deatrice Dine , MD, [id number] CSN: [id number] Age: 38 Admit Type: Outpatient Procedure:                Colonoscopy Indications:              Generalized abdominal pain, Incidental change in                            bowel habits noted Providers:                Deatrice Dine, MD, Madelin Hunter, RN, Jon Loge Referring MD:              Medicines:                Monitored Anesthesia Care Complications:            No immediate complications. Estimated Blood Loss:     Estimated blood loss: none. Procedure:                Pre-Anesthesia Assessment:                           - Prior to the procedure, a History and Physical                            was performed, and patient medications and                            allergies were reviewed. The patient's tolerance of                            previous anesthesia was also reviewed. The risks                            and benefits of the procedure and the sedation                            options and risks were discussed with the patient.                            All questions were answered, and informed consent                            was obtained. Prior Anticoagulants: The patient has                            taken no anticoagulant or antiplatelet agents. ASA                            Grade Assessment: II - A patient with mild systemic                            disease.  After reviewing the risks and benefits,                            the patient was deemed in satisfactory condition to                            undergo the procedure.                           After obtaining informed consent, the colonoscope                            was passed under direct vision. Throughout the                            procedure, the patient's blood pressure, pulse, and                             oxygen saturations were monitored continuously. The                            [id number]) scope was introduced through                            the anus and advanced to the the terminal ileum.                            The colonoscopy was performed without difficulty.                            The patient tolerated the procedure well. The                            quality of the bowel preparation was evaluated                            using the BBPS [facility] Bowel Preparation Scale)                            with scores of: Right Colon = 3, Transverse Colon =                            3 and Left Colon = 3 (entire mucosa seen well with                            no residual staining, small fragments of stool or                            opaque liquid). The total BBPS score equals 9. The                            terminal ileum, ileocecal valve, appendiceal  orifice, and rectum were photographed. Scope In: 12:39:46 PM Scope Out: 12:51:20 PM Scope Withdrawal Time: 0 hours 8 minutes 11 seconds  Total Procedure Duration: 0 hours 11 minutes 34 seconds  Findings:      There is no endoscopic evidence of inflammation or ulcerations in the       entire colon.      Bleeding internal hemorrhoids were found during retroflexion. The       hemorrhoids were small.      The terminal ileum appeared normal. Impression:               - Bleeding internal hemorrhoids.                           - The examined portion of the ileum was normal.                           - No specimens collected. Moderate Sedation:      Per Anesthesia Care Recommendation:           - Patient has a contact number available for                            emergencies. The signs and symptoms of potential                            delayed complications were discussed with the                            patient. Return to normal activities tomorrow.                             Written discharge instructions were provided to the                            patient.                           - Resume previous diet.                           - Continue present medications.                           - Repeat colonoscopy in 10 years for screening                            purposes.                           - Return to primary care physician as previously                            scheduled. Procedure Code(s):        --- Professional ---                           [phone], Colonoscopy, flexible; diagnostic, including  collection of specimen(s) by brushing or washing,                            when performed (separate procedure) Diagnosis Code(s):        --- Professional ---                           K64.8, Other hemorrhoids                           R10.84, Generalized abdominal pain CPT copyright 2022 American Medical Association. All rights reserved. The codes documented in this report are preliminary and upon coder review may  be revised to meet current compliance requirements. Deatrice Dine, MD Deatrice Dine, MD 03/07/2024 1:01:01 PM This report has been signed electronically. Number of Addenda: 0

## 2024-03-08 ENCOUNTER — Encounter (HOSPITAL_COMMUNITY): Payer: Self-pay | Admitting: Gastroenterology

## 2024-03-11 ENCOUNTER — Ambulatory Visit: Admitting: Nurse Practitioner

## 2024-03-11 NOTE — Anesthesia Postprocedure Evaluation (Signed)
 Anesthesia Post Note  Patient: Daniel Phelps  Procedure(s) Performed: COLONOSCOPY  Patient location during evaluation: Phase II Anesthesia Type: General Level of consciousness: awake Pain management: pain level controlled Vital Signs Assessment: post-procedure vital signs reviewed and stable Respiratory status: spontaneous breathing and respiratory function stable Cardiovascular status: blood pressure returned to baseline and stable Postop Assessment: no headache and no apparent nausea or vomiting Anesthetic complications: no Comments: Late entry   No notable events documented.   Last Vitals:  Vitals:   03/07/24 1033 03/07/24 1252  BP: 121/77 (!) 106/51  Pulse: (!) 57 69  Resp: 12 20  Temp: 36.5 C 36.6 C  SpO2: 99% 95%    Last Pain:  Vitals:   03/07/24 1256  TempSrc:   PainSc: 0-No pain                 Yvonna JINNY Bosworth

## 2024-03-22 ENCOUNTER — Telehealth: Payer: Self-pay | Admitting: Cardiology

## 2024-03-22 ENCOUNTER — Ambulatory Visit: Payer: Self-pay | Admitting: Cardiology

## 2024-03-22 ENCOUNTER — Ambulatory Visit: Admitting: Cardiovascular Disease

## 2024-03-22 ENCOUNTER — Ambulatory Visit: Attending: Cardiology

## 2024-03-22 DIAGNOSIS — R011 Cardiac murmur, unspecified: Secondary | ICD-10-CM | POA: Diagnosis present

## 2024-03-22 LAB — ECHOCARDIOGRAM COMPLETE
AR max vel: 3.23 cm2
AV Area VTI: 3.13 cm2
AV Area mean vel: 3.06 cm2
AV Mean grad: 3 mmHg
AV Peak grad: 5.4 mmHg
Ao pk vel: 1.16 m/s
Area-P 1/2: 1.84 cm2
MV VTI: 1.57 cm2
S' Lateral: 3 cm

## 2024-03-22 NOTE — Telephone Encounter (Signed)
 Patient says he forgot to request a doctor's note at appointment during appointment today. He would like to know if a note can be sent to his MyChart. Not sure whether he can show AVS.

## 2024-03-22 NOTE — Telephone Encounter (Signed)
 Left message for patient to callback.  Work excuse note for echo appt today sent to patient via MyChart.

## 2024-03-23 ENCOUNTER — Ambulatory Visit: Admitting: Cardiology

## 2024-03-23 ENCOUNTER — Encounter (HOSPITAL_COMMUNITY): Payer: Self-pay | Admitting: *Deleted

## 2024-03-24 ENCOUNTER — Encounter: Payer: Self-pay | Admitting: Family Medicine

## 2024-03-24 ENCOUNTER — Encounter: Payer: Self-pay | Admitting: Nurse Practitioner

## 2024-03-24 ENCOUNTER — Ambulatory Visit: Payer: Self-pay | Admitting: Family Medicine

## 2024-03-24 VITALS — BP 117/73 | HR 68 | Ht 74.0 in | Wt 220.0 lb

## 2024-03-24 DIAGNOSIS — R0602 Shortness of breath: Secondary | ICD-10-CM

## 2024-03-24 DIAGNOSIS — R1084 Generalized abdominal pain: Secondary | ICD-10-CM

## 2024-03-24 DIAGNOSIS — R109 Unspecified abdominal pain: Secondary | ICD-10-CM | POA: Diagnosis not present

## 2024-03-24 DIAGNOSIS — G8929 Other chronic pain: Secondary | ICD-10-CM | POA: Diagnosis not present

## 2024-03-24 MED ORDER — PROMETHAZINE-DM 6.25-15 MG/5ML PO SYRP: 5.0000 mL | ORAL_SOLUTION | ORAL | 0 refills | Status: AC | PRN

## 2024-03-24 NOTE — Patient Instructions (Addendum)
          Great to see you today.  I have refilled the medication(s) we provide.   12/09/23 MRI Abdomen Findings:  A hemangioma is a non-cancerous (benign) mass made up of blood vessels. They are very common in the liver and usually do not cause symptoms or require treatment. Most people simply monitor them with periodic imaging to ensure they remain stable   If labs were collected, we will inform you of lab results once received either by echart message or telephone call.   - echart message- for normal results that have been seen by the patient already.   - telephone call: abnormal results or if patient has not viewed results in their echart.   - Please take medications as prescribed. - Follow up with your primary health provider if any health concerns arises. - If symptoms worsen please contact your primary care provider and/or visit the emergency department.

## 2024-03-24 NOTE — Progress Notes (Signed)
 Established Patient Office Visit   Subjective  Patient ID: Daniel Phelps, male    DOB: [birth year]  Age: 38 y.o. MRN: [id number]  Chief Complaint  Patient presents with   Establish Care   Abdominal Pain    Present a year and getting worse, feels better when standing    He  has a past medical history of Anxiety, Chest wall pain (02/17/2024), Chronic abdominal pain (11/02/2023), Chronic back pain, Chronic chest wall pain (10/21/2022), Chronic idiopathic constipation (11/18/2023), Constipation (02/17/2024), Depression, GERD (gastroesophageal reflux disease), Liver lesion (11/18/2023), Schizophrenia spectrum disorder with psychotic disorder type not yet determined (HCC) (09/11/2022), Substance abuse (HCC), and Upper abdominal pain (02/17/2024).  The patient reports chronic abdominal pain that has been present for over one year and is gradually worsening. The pain is constant, located in the epigastric region as well as the left and right upper quadrants, and is rated 7/10 in severity. He describes the discomfort as a tightness, shortness of breath and soreness that does not radiate. The pain is associated with constipation but is not accompanied by diarrhea, nausea, or vomiting. It is exacerbated by inadequate rest and relieved by rest. The patient has not tried any treatments for the symptoms. The patient reports feeling a mass in the left upper quadrant of the abdomen. Prior diagnostic workup includes an upper endoscopy MRI and a CT scan. Patient is being followed by GI.    Review of Systems  Constitutional:  Negative for chills and fever.  Respiratory:  Positive for shortness of breath.   Cardiovascular:  Negative for chest pain.  Gastrointestinal:  Positive for abdominal pain and constipation. Negative for diarrhea, nausea and vomiting.      Objective:     BP 117/73   Pulse 68   Ht 6' 2 (1.88 m)   Wt 220 lb (99.8 kg)   SpO2 95%   BMI 28.25 kg/m  BP Readings from Last 3 Encounters:   03/24/24 117/73  03/07/24 (!) 106/51  03/02/24 122/80      Physical Exam Vitals reviewed.  Constitutional:      General: He is not in acute distress.    Appearance: Normal appearance. He is not ill-appearing, toxic-appearing or diaphoretic.  HENT:     Head: Normocephalic.  Eyes:     General:        Right eye: No discharge.        Left eye: No discharge.     Conjunctiva/sclera: Conjunctivae normal.  Cardiovascular:     Rate and Rhythm: Normal rate.     Pulses: Normal pulses.     Heart sounds: Normal heart sounds.  Pulmonary:     Effort: Pulmonary effort is normal. No respiratory distress.     Breath sounds: Normal breath sounds.  Abdominal:     General: Bowel sounds are normal.     Palpations: Abdomen is soft.     Tenderness: There is no abdominal tenderness. There is no guarding.  Skin:    General: Skin is warm and dry.  Neurological:     Mental Status: He is alert.  Psychiatric:        Mood and Affect: Mood normal.      No results found for any visits on 03/24/24.  The ASCVD Risk score (Arnett DK, et al., 2019) failed to calculate for the following reasons:   The 2019 ASCVD risk score is only valid for ages 82 to 78    Assessment & Plan:  SOB (shortness of breath) -  Pulmonary Visit  Generalized abdominal pain  Chronic abdominal pain Assessment & Plan: Abdominal CT ordered  Advise Increase oral fluid intake. Bland diet as tolerated. Avoid fluids that have a lot sugar or caffeine, Avoid spicy or fatty food. Avoid alcohol. Can take OTC tylenol  for pain. Follow-up in unable to keep food/fluid down x 24 hours, dizziness, fevers, worsening or persistent symptoms to present to ED or contact primary care provider. Patient verbalizes understanding regarding plan of care and all questions answered.   Orders: -     CT ABDOMEN PELVIS WO CONTRAST; Future  Other orders -     Promethazine -DM; Take 5 mLs by mouth as needed.  Dispense: 118 mL; Refill: 0    Return  if symptoms worsen or fail to improve.   Hilario Kidd Wilhelmena Falter, FNP

## 2024-03-24 NOTE — Assessment & Plan Note (Addendum)
 Abdominal CT ordered  Advise Increase oral fluid intake. Bland diet as tolerated. Avoid fluids that have a lot sugar or caffeine, Avoid spicy or fatty food. Avoid alcohol. Can take OTC tylenol  for pain. Follow-up in unable to keep food/fluid down x 24 hours, dizziness, fevers, worsening or persistent symptoms to present to ED or contact primary care provider.

## 2024-04-05 ENCOUNTER — Encounter: Payer: Self-pay | Admitting: Cardiology

## 2024-04-05 ENCOUNTER — Other Ambulatory Visit

## 2024-04-05 ENCOUNTER — Ambulatory Visit: Attending: Cardiology

## 2024-04-05 ENCOUNTER — Ambulatory Visit (INDEPENDENT_AMBULATORY_CARE_PROVIDER_SITE_OTHER)

## 2024-04-05 DIAGNOSIS — R0789 Other chest pain: Secondary | ICD-10-CM | POA: Diagnosis present

## 2024-04-07 NOTE — Progress Notes (Deleted)
   Daniel Phelps, male    DOB: 05-29-1986    MRN: [id number]   Brief patient profile:  38  yo*** *** referred to pulmonary clinic in [place]  04/08/2024 by *** for ***   Pt not previously seen by PCCM service.    History of Present Illness  04/08/2024  Pulmonary/ 1st office eval/ Daniel Phelps /  Office  No chief complaint on file.    Dyspnea:  *** Cough: *** Sleep: *** SABA use: *** 02: *** LDSCT:***  No obvious day to day or daytime pattern/variability or assoc excess/ purulent sputum or mucus plugs or hemoptysis or cp or chest tightness, subjective wheeze or overt sinus or hb symptoms.    Also denies any obvious fluctuation of symptoms with weather or environmental changes or other aggravating or alleviating factors except as outlined above   No unusual exposure hx or h/o childhood pna/ asthma or knowledge of premature birth.  Current Allergies, Complete Past Medical History, Past Surgical History, Family History, and Social History were reviewed in [employer] record.  ROS  The following are not active complaints unless bolded Hoarseness, sore throat, dysphagia, dental problems, itching, sneezing,  nasal congestion or discharge of excess mucus or purulent secretions, ear ache,   fever, chills, sweats, unintended wt loss or wt gain, classically pleuritic or exertional cp,  orthopnea pnd or arm/hand swelling  or leg swelling, presyncope, palpitations, abdominal pain, anorexia, nausea, vomiting, diarrhea  or change in bowel habits or change in bladder habits, change in stools or change in urine, dysuria, hematuria,  rash, arthralgias, visual complaints, headache, numbness, weakness or ataxia or problems with walking or coordination,  change in mood or  memory.            Outpatient Medications Prior to Visit  Medication Sig Dispense Refill   OVER THE COUNTER MEDICATION Mint greens     promethazine -dextromethorphan (PROMETHAZINE -DM) 6.25-15 MG/5ML syrup Take  5 mLs by mouth as needed. 118 mL 0   No facility-administered medications prior to visit.    Past Medical History:  Diagnosis Date   Anxiety    Chest wall pain 02/17/2024   Chronic abdominal pain 11/02/2023   Chronic back pain    Chronic chest wall pain 10/21/2022   Chronic idiopathic constipation 11/18/2023   Constipation 02/17/2024   Depression    GERD (gastroesophageal reflux disease)    Liver lesion 11/18/2023   Schizophrenia spectrum disorder with psychotic disorder type not yet determined (HCC) 09/11/2022   Substance abuse (HCC)    Upper abdominal pain 02/17/2024      Objective:     There were no vitals taken for this visit.         Assessment

## 2024-04-08 ENCOUNTER — Ambulatory Visit: Admitting: Internal Medicine

## 2024-04-12 ENCOUNTER — Telehealth: Payer: Self-pay | Admitting: Nurse Practitioner

## 2024-04-12 NOTE — Telephone Encounter (Signed)
 SABRA

## 2024-04-13 ENCOUNTER — Ambulatory Visit (HOSPITAL_COMMUNITY)

## 2024-04-13 ENCOUNTER — Telehealth: Payer: Self-pay | Admitting: Nurse Practitioner

## 2024-04-13 NOTE — Telephone Encounter (Signed)
 No Message attached?

## 2024-04-13 NOTE — Telephone Encounter (Signed)
 Copied from CRM #[id number]. Topic: Referral - Status >> Apr 12, 2024  4:29 PM Antwanette L wrote: Reason for CRM: Patient received a call today from Radiology saying his referral for a CT scan was denied The scan is to check for a leisure on his liver. Medicaid said the referral can be expedited.  The patient can be contacted via MyChart

## 2024-04-14 NOTE — Telephone Encounter (Signed)
 I sent you an Inbasket making you aware of denial and Clinical Rationale.  Please Advise.

## 2024-05-19 ENCOUNTER — Ambulatory Visit (INDEPENDENT_AMBULATORY_CARE_PROVIDER_SITE_OTHER): Admitting: Gastroenterology

## 2024-05-19 ENCOUNTER — Ambulatory Visit: Admitting: Internal Medicine

## 2024-06-22 DIAGNOSIS — R002 Palpitations: Secondary | ICD-10-CM

## 2024-07-01 ENCOUNTER — Emergency Department (HOSPITAL_COMMUNITY)
Admission: EM | Admit: 2024-07-01 | Discharge: 2024-07-01 | Disposition: A | Discharge status: Home / Self Care | Attending: Emergency Medicine | Admitting: Emergency Medicine

## 2024-07-01 ENCOUNTER — Emergency Department (HOSPITAL_COMMUNITY)

## 2024-07-01 ENCOUNTER — Encounter (HOSPITAL_COMMUNITY): Payer: Self-pay

## 2024-07-01 DIAGNOSIS — R1010 Upper abdominal pain, unspecified: Secondary | ICD-10-CM | POA: Diagnosis not present

## 2024-07-01 DIAGNOSIS — R0789 Other chest pain: Secondary | ICD-10-CM | POA: Diagnosis present

## 2024-07-01 LAB — CBC
HCT: 45.3 % (ref 39.0–52.0)
Hemoglobin: 15.4 g/dL (ref 13.0–17.0)
MCH: 30.7 pg (ref 26.0–34.0)
MCHC: 34 g/dL (ref 30.0–36.0)
MCV: 90.2 fL (ref 80.0–100.0)
Platelets: 260 K/uL (ref 150–400)
RBC: 5.02 MIL/uL (ref 4.22–5.81)
RDW: 11.7 % (ref 11.5–15.5)
WBC: 6.7 K/uL (ref 4.0–10.5)
nRBC: 0 % (ref 0.0–0.2)

## 2024-07-01 LAB — BASIC METABOLIC PANEL WITH GFR
Anion gap: 11 (ref 5–15)
BUN: 22 mg/dL — ABNORMAL HIGH (ref 6–20)
CO2: 28 mmol/L (ref 22–32)
Calcium: 9.2 mg/dL (ref 8.9–10.3)
Chloride: 104 mmol/L (ref 98–111)
Creatinine, Ser: 1.04 mg/dL (ref 0.61–1.24)
GFR, Estimated: >60 mL/min (ref >60)
Glucose, Bld: 101 mg/dL — ABNORMAL HIGH (ref 70–99)
Potassium: 3.8 mmol/L (ref 3.5–5.1)
Sodium: 142 mmol/L (ref 135–145)

## 2024-07-01 NOTE — ED Provider Notes (Signed)
 [place] EMERGENCY DEPARTMENT AT [facility] Provider Note   CSN: [id number] Arrival date & time: 07/01/24  1727     Patient presents with: Abdominal Pain   Daniel Phelps is a 38 y.o. male.    Abdominal Pain Patient presents with left lower chest wall.  States has had for a while.  States also in upper abdomen at times.  States he has had extensive workup and may only measure the side of his organs.  States does have some hallucinations at times.  States he is really only here for his pain.    Past Medical History:  Diagnosis Date   Anxiety    Chest wall pain 02/17/2024   Chronic abdominal pain 11/02/2023   Chronic back pain    Chronic chest wall pain 10/21/2022   Chronic idiopathic constipation 11/18/2023   Constipation 02/17/2024   Depression    GERD (gastroesophageal reflux disease)    Liver lesion 11/18/2023   Schizophrenia spectrum disorder with psychotic disorder type not yet determined (HCC) 09/11/2022   Substance abuse (HCC)    Upper abdominal pain 02/17/2024    Prior to Admission medications   Medication Sig Start Date End Date Taking? Authorizing Provider  OVER THE COUNTER MEDICATION Mint greens    [provider]  promethazine -dextromethorphan (PROMETHAZINE -DM) 6.25-15 MG/5ML syrup Take 5 mLs by mouth as needed. 03/24/24   Del Wilhelmena Lloyd Sola, FNP    Allergies: Ibuprofen    Review of Systems  Gastrointestinal:  Positive for abdominal pain.    Updated Vital Signs BP (!) 135/94 (BP Location: Right Arm)   Pulse 71   Temp 98.5 F (36.9 C) (Oral)   Resp 16   Ht 6' 2 (1.88 m)   Wt 10.4 kg   SpO2 96%   BMI 2.95 kg/m   Physical Exam Vitals and nursing note reviewed.  Pulmonary:     Comments: Point tenderness to left anterior lower chest wall.  No rash. Chest:     Chest wall: Tenderness present.  Abdominal:     Tenderness: There is no abdominal tenderness.  Neurological:     Mental Status: He is alert.      (all labs ordered are listed, but only abnormal results are displayed) Labs Reviewed  BASIC METABOLIC PANEL WITH GFR - Abnormal; Notable for the following components:      Result Value   Glucose, Bld 101 (*)    BUN 22 (*)    All other components within normal limits  CBC    EKG: None  Radiology: DG Ribs Unilateral W/Chest Left Result Date: 07/01/2024 EXAM: 1 VIEW(S) XRAY OF THE LEFT RIBS AND CHEST 07/01/2024 06:11:52 PM COMPARISON: 03/08/2024 CLINICAL HISTORY: lower chest pain FINDINGS: BONES: No acute displaced rib fracture. LUNGS AND PLEURA: No consolidation or pulmonary edema. No pleural effusion or pneumothorax. HEART AND MEDIASTINUM: No acute abnormality of the cardiac and mediastinal silhouettes. IMPRESSION: 1. No acute rib fracture. 2. No acute cardiopulmonary process. Electronically signed by: Franky Crease MD 07/01/2024 06:19 PM EST RP Workstation: [id number]     Procedures   Medications Ordered in the ED - No data to display                                  Medical Decision Making Amount and/or Complexity of Data Reviewed Labs: ordered. Radiology: ordered.   Patient anterior lower chest pain.  Has had pains like this  for a while.  Open review of notes he presented had extensive workup including MRIs CTs.  Overall I think low risk.  Reproducible.  Likely chest wall pain.  Rib films reassuring.  Discharge home     Final diagnoses:  Chest wall pain    ED Discharge Orders     None          Patsey Lot, MD 07/01/24 2258

## 2024-07-01 NOTE — ED Triage Notes (Signed)
 Pt comes in for upper abd pain. Pt states h has been checked out before and everything was negative. Pt states it feels its muscle pain, not in his organs. Pt now feels foggy.   Pt is having auditory hallucinations.   Pt is A&Ox4.
